# Patient Record
Sex: Female | Born: 1980 | Race: Black or African American | Hispanic: No | Marital: Single | Smoking: Never smoker
Health system: Southern US, Community
[De-identification: ages and names within clinical notes are randomized; demographics above are authoritative.]

## PROBLEM LIST (undated history)

## (undated) DIAGNOSIS — F419 Anxiety disorder, unspecified: Secondary | ICD-10-CM

## (undated) HISTORY — PX: INDUCED ABORTION: SHX677

## (undated) HISTORY — PX: THORACIC OUTLET SURGERY: SHX2502

---

## 2004-12-28 ENCOUNTER — Emergency Department: Payer: Self-pay | Admitting: General Practice

## 2004-12-31 ENCOUNTER — Emergency Department: Payer: Self-pay | Admitting: Unknown Physician Specialty

## 2005-08-19 ENCOUNTER — Inpatient Hospital Stay: Payer: Self-pay

## 2010-02-07 ENCOUNTER — Ambulatory Visit: Payer: Self-pay | Admitting: Family Medicine

## 2010-06-21 ENCOUNTER — Observation Stay: Payer: Self-pay | Admitting: Obstetrics and Gynecology

## 2010-06-21 ENCOUNTER — Inpatient Hospital Stay: Payer: Self-pay

## 2012-07-30 ENCOUNTER — Emergency Department: Payer: Self-pay | Admitting: Unknown Physician Specialty

## 2012-07-30 LAB — DRUG SCREEN, URINE
Amphetamines, Ur Screen: NEGATIVE (ref ?–1000)
Cannabinoid 50 Ng, Ur ~~LOC~~: NEGATIVE (ref ?–50)
Cocaine Metabolite,Ur ~~LOC~~: NEGATIVE (ref ?–300)
MDMA (Ecstasy)Ur Screen: NEGATIVE (ref ?–500)
Methadone, Ur Screen: NEGATIVE (ref ?–300)
Opiate, Ur Screen: NEGATIVE (ref ?–300)
Phencyclidine (PCP) Ur S: NEGATIVE (ref ?–25)
Tricyclic, Ur Screen: NEGATIVE (ref ?–1000)

## 2012-07-30 LAB — COMPREHENSIVE METABOLIC PANEL
Albumin: 3.9 g/dL (ref 3.4–5.0)
Anion Gap: 5 — ABNORMAL LOW (ref 7–16)
BUN: 12 mg/dL (ref 7–18)
Bilirubin,Total: 0.3 mg/dL (ref 0.2–1.0)
Chloride: 109 mmol/L — ABNORMAL HIGH (ref 98–107)
Creatinine: 0.81 mg/dL (ref 0.60–1.30)
EGFR (African American): >60
EGFR (Non-African Amer.): >60
Glucose: 75 mg/dL (ref 65–99)
Osmolality: 278 (ref 275–301)
Potassium: 3.3 mmol/L — ABNORMAL LOW (ref 3.5–5.1)
SGOT(AST): 23 U/L (ref 15–37)
Sodium: 140 mmol/L (ref 136–145)
Total Protein: 8.6 g/dL — ABNORMAL HIGH (ref 6.4–8.2)

## 2012-07-30 LAB — ETHANOL
Ethanol %: <0.003 % (ref 0.000–0.080)
Ethanol: <3 mg/dL

## 2012-07-30 LAB — URINALYSIS, COMPLETE
Bacteria: NONE SEEN
Bilirubin,UR: NEGATIVE
Glucose,UR: NEGATIVE mg/dL (ref 0–75)
Leukocyte Esterase: NEGATIVE
Protein: NEGATIVE
RBC,UR: NONE SEEN /HPF (ref 0–5)
Specific Gravity: 1.008 (ref 1.003–1.030)
Squamous Epithelial: <1

## 2012-07-30 LAB — CBC
HCT: 38.7 % (ref 35.0–47.0)
MCV: 93 fL (ref 80–100)
Platelet: 212 10*3/uL (ref 150–440)
RBC: 4.18 10*6/uL (ref 3.80–5.20)
RDW: 13.2 % (ref 11.5–14.5)

## 2012-07-30 LAB — CK TOTAL AND CKMB (NOT AT ARMC): CK, Total: 70 U/L (ref 21–215)

## 2013-01-20 ENCOUNTER — Emergency Department: Payer: Self-pay | Admitting: Emergency Medicine

## 2013-01-20 LAB — COMPREHENSIVE METABOLIC PANEL
Albumin: 3.9 g/dL (ref 3.4–5.0)
Alkaline Phosphatase: 74 U/L (ref 50–136)
BUN: 13 mg/dL (ref 7–18)
Bilirubin,Total: 0.2 mg/dL (ref 0.2–1.0)
Chloride: 107 mmol/L (ref 98–107)
Co2: 23 mmol/L (ref 21–32)
Creatinine: 0.85 mg/dL (ref 0.60–1.30)
EGFR (African American): >60
EGFR (Non-African Amer.): >60
Glucose: 107 mg/dL — ABNORMAL HIGH (ref 65–99)
SGOT(AST): 15 U/L (ref 15–37)
SGPT (ALT): 16 U/L (ref 12–78)
Total Protein: 8.1 g/dL (ref 6.4–8.2)

## 2013-01-20 LAB — CBC
MCHC: 34.1 g/dL (ref 32.0–36.0)
MCV: 94 fL (ref 80–100)
Platelet: 195 10*3/uL (ref 150–440)
RDW: 13.8 % (ref 11.5–14.5)
WBC: 10.2 10*3/uL (ref 3.6–11.0)

## 2014-09-16 ENCOUNTER — Emergency Department
Admission: EM | Admit: 2014-09-16 | Discharge: 2014-09-16 | Disposition: A | Discharge status: Home / Self Care | Payer: Medicaid Other | Attending: Emergency Medicine | Admitting: Emergency Medicine

## 2014-09-16 ENCOUNTER — Other Ambulatory Visit: Payer: Self-pay

## 2014-09-16 ENCOUNTER — Encounter: Payer: Self-pay | Admitting: Emergency Medicine

## 2014-09-16 ENCOUNTER — Emergency Department: Payer: Medicaid Other

## 2014-09-16 DIAGNOSIS — R1030 Lower abdominal pain, unspecified: Secondary | ICD-10-CM | POA: Insufficient documentation

## 2014-09-16 DIAGNOSIS — Z3202 Encounter for pregnancy test, result negative: Secondary | ICD-10-CM | POA: Insufficient documentation

## 2014-09-16 DIAGNOSIS — R55 Syncope and collapse: Secondary | ICD-10-CM | POA: Diagnosis not present

## 2014-09-16 DIAGNOSIS — R070 Pain in throat: Secondary | ICD-10-CM | POA: Diagnosis not present

## 2014-09-16 DIAGNOSIS — R0602 Shortness of breath: Secondary | ICD-10-CM | POA: Diagnosis present

## 2014-09-16 LAB — WET PREP, GENITAL
Clue Cells Wet Prep HPF POC: NONE SEEN
TRICH WET PREP: NONE SEEN
YEAST WET PREP: NONE SEEN

## 2014-09-16 LAB — URINALYSIS COMPLETE WITH MICROSCOPIC (ARMC ONLY)
BILIRUBIN URINE: NEGATIVE
Bacteria, UA: NONE SEEN
GLUCOSE, UA: NEGATIVE mg/dL
Hgb urine dipstick: NEGATIVE
Ketones, ur: NEGATIVE mg/dL
Leukocytes, UA: NEGATIVE
NITRITE: NEGATIVE
PH: 6 (ref 5.0–8.0)
Protein, ur: NEGATIVE mg/dL
RBC / HPF: NONE SEEN RBC/hpf (ref 0–5)
Specific Gravity, Urine: 1.016 (ref 1.005–1.030)

## 2014-09-16 LAB — BASIC METABOLIC PANEL
ANION GAP: 9 (ref 5–15)
BUN: 17 mg/dL (ref 6–20)
CO2: 28 mmol/L (ref 22–32)
Calcium: 9.9 mg/dL (ref 8.9–10.3)
Chloride: 105 mmol/L (ref 101–111)
Creatinine, Ser: 0.74 mg/dL (ref 0.44–1.00)
GFR calc Af Amer: >60 mL/min (ref >60)
GFR calc non Af Amer: >60 mL/min (ref >60)
GLUCOSE: 103 mg/dL — AB (ref 65–99)
Potassium: 3.8 mmol/L (ref 3.5–5.1)
Sodium: 142 mmol/L (ref 135–145)

## 2014-09-16 LAB — HEPATIC FUNCTION PANEL
ALK PHOS: 48 U/L (ref 38–126)
ALT: 15 U/L (ref 14–54)
AST: 21 U/L (ref 15–41)
Albumin: 4.1 g/dL (ref 3.5–5.0)
Total Bilirubin: 0.3 mg/dL (ref 0.3–1.2)
Total Protein: 7.8 g/dL (ref 6.5–8.1)

## 2014-09-16 LAB — CBC
HCT: 37.4 % (ref 35.0–47.0)
Hemoglobin: 12.3 g/dL (ref 12.0–16.0)
MCH: 31.5 pg (ref 26.0–34.0)
MCHC: 32.9 g/dL (ref 32.0–36.0)
MCV: 95.6 fL (ref 80.0–100.0)
Platelets: 184 10*3/uL (ref 150–440)
RBC: 3.91 MIL/uL (ref 3.80–5.20)
RDW: 13.7 % (ref 11.5–14.5)
WBC: 7.2 10*3/uL (ref 3.6–11.0)

## 2014-09-16 LAB — CHLAMYDIA/NGC RT PCR (ARMC ONLY)
Chlamydia Tr: NOT DETECTED
N gonorrhoeae: NOT DETECTED

## 2014-09-16 LAB — LIPASE, BLOOD: Lipase: 29 U/L (ref 22–51)

## 2014-09-16 LAB — TROPONIN I

## 2014-09-16 LAB — POC URINE PREG, ED: PREG TEST UR: NEGATIVE

## 2014-09-16 MED ORDER — GI COCKTAIL ~~LOC~~
30.0000 mL | Freq: Once | ORAL | Status: AC
  Administered 2014-09-16: 30 mL via ORAL

## 2014-09-16 MED ORDER — GI COCKTAIL ~~LOC~~
ORAL | Status: AC
  Administered 2014-09-16: 30 mL via ORAL
  Filled 2014-09-16: qty 30

## 2014-09-16 NOTE — ED Notes (Signed)
RN called lab to add on troponin, stated they would.

## 2014-09-16 NOTE — ED Provider Notes (Addendum)
[facility] Emergency Department Provider Note ____________________________________________  Time seen: Approximately 550 AM  I have reviewed the triage vital signs and the nursing notes.   HISTORY  Chief Complaint Shortness of Breath    HPI Shelby Hoffman is a 34 y.o. female without pertinent medical history presents today with shortness of breath and a near-syncopal episode at work earlier [employer]. Patient says that she reported to her job at [employer] and when she began working she was becoming short of breath with exertion. She said that she stepped outside to get fresh air and at that point became lightheaded and had a feeling that she would pass out however did not. She also complaining of difficulty swallowing. However, denies any throat pain,nausea vomiting or diarrhea. At this time the patient is asymptomatic and without pain. Denied any chest pain at any point.   No past medical history on file.  There are no active problems to display for this patient.   No past surgical history on file.  No current outpatient prescriptions on file.  Allergies Tomato  No family history on file.  Social History History  Substance Use Topics  . Smoking status: Never Smoker   . Smokeless tobacco: Not on file  . Alcohol Use: No    Review of Systems Constitutional: No fever/chills Eyes: No visual changes. ENT: No sore throat. Cardiovascular: Denies chest pain. Respiratory: As above  Gastrointestinal: No abdominal pain.  No nausea, no vomiting.  No diarrhea.  No constipation. Genitourinary: Negative for dysuria. Musculoskeletal: Negative for back pain. Skin: Negative for rash. Neurological: Negative for headaches, focal weakness or numbness.  10-point ROS otherwise negative.  ____________________________________________   PHYSICAL EXAM:  VITAL SIGNS: ED Triage Vitals  Enc Vitals Group     BP 09/16/14 0159 101/53 mmHg     Pulse Rate 09/16/14  0159 93     Resp 09/16/14 0159 18     Temp 09/16/14 0159 98.1 F (36.7 C)     Temp Source 09/16/14 0159 Oral     SpO2 09/16/14 0159 100 %     Weight 09/16/14 0159 154 lb (69.854 kg)     Height 09/16/14 0159 5\' 1"  (1.549 m)     Head Cir --      Peak Flow --      Pain Score 09/16/14 0201 6     Pain Loc --      Pain Edu? --      Excl. in GC? --     Constitutional: Alert and oriented. Well appearing and in no acute distress. Eyes: Conjunctivae are normal. PERRL. EOMI. Head: Atraumatic. Nose: No congestion/rhinnorhea. Mouth/Throat: Mucous membranes are moist.  Oropharynx non-erythematous. Neck: No stridor.   Cardiovascular: Normal rate, regular rhythm. Grossly normal heart sounds.  Good peripheral circulation. Respiratory: Normal respiratory effort.  No retractions. Lungs CTAB. Gastrointestinal: Soft but with suprapubic tenderness palpation. No distention. No abdominal bruits. No CVA tenderness. Genitourinary: Normal external exam. Bimanual exam with minimal clear discharge. Bimanual exam with CMT as well as uterine tenderness. There is no adnexal tenderness or masses. Musculoskeletal: No lower extremity tenderness nor edema.  No joint effusions. Neurologic:  Normal speech and language. No gross focal neurologic deficits are appreciated. Speech is normal. No gait instability. Skin:  Skin is warm, dry and intact. No rash noted. Psychiatric: Mood and affect are normal. Speech and behavior are normal.  ____________________________________________   LABS (all labs ordered are listed, but only abnormal results are displayed)  Labs Reviewed  BASIC METABOLIC PANEL - Abnormal; Notable for the following:    Glucose, Bld 103 (*)    All other components within normal limits  URINALYSIS COMPLETEWITH MICROSCOPIC ([facility] ONLY) - Abnormal; Notable for the following:    Color, Urine YELLOW (*)    APPearance CLEAR (*)    Squamous Epithelial / LPF 0-5 (*)    All other components within normal  limits  HEPATIC FUNCTION PANEL - Abnormal; Notable for the following:    Bilirubin, Direct <0.1 (*)    All other components within normal limits  WET PREP, GENITAL  CHLAMYDIA/NGC RT PCR ([facility] ONLY)  CBC  TROPONIN I  LIPASE, BLOOD  POC URINE PREG, ED   ____________________________________________  EKG  ED ECG REPORT I, Arelia Longest, the attending physician, personally viewed and interpreted this ECG.   Date: 09/16/2014  EKG Time: 208  Rate: 70  Rhythm: normal sinus rhythm with marked sinus arrhythmia  Axis: Normal axis  Intervals:none  ST&T Change: No ST elevations or depressions. No abnormal T-wave inversions.  ____________________________________________  RADIOLOGY  Negative chest x-ray ____________________________________________   PROCEDURES   ____________________________________________   INITIAL IMPRESSION / ASSESSMENT AND PLAN / ED COURSE  Pertinent labs & imaging results that were available during my care of the patient were reviewed by me and considered in my medical decision making (see chart for details).  ----------------------------------------- 7:14 AM on 09/16/2014 -----------------------------------------  Patient resting comfortably without any complaints at this time. Reassuring workup. Possible early start to viral illness. Patient's other daughter was recently sick with malaise and vomiting. Patient also with nonspecific symptoms which could also signal the start of a viral illness. Advised the patient to drink plenty of fluids and use hot tea or cough drops for throat irritation. No stricture or belly pain worsens or moves to the right lower part of the abdomen. He should not concern for sexual transmitted diseases. Only a few white blood cells on the wet mount. ____________________________________________   FINAL CLINICAL IMPRESSION(S) / ED DIAGNOSES  Acute near syncope. Acute shortness of breath. Acute throat pain. Initial  visit.     Myrna Blazer, MD 09/16/14 0715  No tenderness over McBurney's point on exam. Also patient denies being on any hormone supplements of birth control. Is PERC negative.  Myrna Blazer, MD 09/16/14 [phone]

## 2014-09-16 NOTE — ED Notes (Signed)
Patient states that around midnight she was at work and started having difficulty breathing.

## 2014-09-16 NOTE — Discharge Instructions (Signed)
Abdominal Pain, Women °Abdominal (stomach, pelvic, or belly) pain can be caused by many things. It is important to tell your doctor: °· The location of the pain. °· Does it come and go or is it present all the time? °· Are there things that start the pain (eating certain foods, exercise)? °· Are there other symptoms associated with the pain (fever, nausea, vomiting, diarrhea)? °All of this is helpful to know when trying to find the cause of the pain. °CAUSES  °· Stomach: virus or bacteria infection, or ulcer. °· Intestine: appendicitis (inflamed appendix), regional ileitis (Crohn's disease), ulcerative colitis (inflamed colon), irritable bowel syndrome, diverticulitis (inflamed diverticulum of the colon), or cancer of the stomach or intestine. °· Gallbladder disease or stones in the gallbladder. °· Kidney disease, kidney stones, or infection. °· Pancreas infection or cancer. °· Fibromyalgia (pain disorder). °· Diseases of the female organs: °¨ Uterus: fibroid (non-cancerous) tumors or infection. °¨ Fallopian tubes: infection or tubal pregnancy. °¨ Ovary: cysts or tumors. °¨ Pelvic adhesions (scar tissue). °¨ Endometriosis (uterus lining tissue growing in the pelvis and on the pelvic organs). °¨ Pelvic congestion syndrome (female organs filling up with blood just before the menstrual period). °¨ Pain with the menstrual period. °¨ Pain with ovulation (producing an egg). °¨ Pain with an IUD (intrauterine device, birth control) in the uterus. °¨ Cancer of the female organs. °· Functional pain (pain not caused by a disease, may improve without treatment). °· Psychological pain. °· Depression. °DIAGNOSIS  °Your doctor will decide the seriousness of your pain by doing an examination. °· Blood tests. °· X-rays. °· Ultrasound. °· CT scan (computed tomography, special type of X-ray). °· MRI (magnetic resonance imaging). °· Cultures, for infection. °· Barium enema (dye inserted in the large intestine, to better view it with  X-rays). °· Colonoscopy (looking in intestine with a lighted tube). °· Laparoscopy (minor surgery, looking in abdomen with a lighted tube). °· Major abdominal exploratory surgery (looking in abdomen with a large incision). °TREATMENT  °The treatment will depend on the cause of the pain.  °· Many cases can be observed and treated at home. °· Over-the-counter medicines recommended by your caregiver. °· Prescription medicine. °· Antibiotics, for infection. °· Birth control pills, for painful periods or for ovulation pain. °· Hormone treatment, for endometriosis. °· Nerve blocking injections. °· Physical therapy. °· Antidepressants. °· Counseling with a psychologist or psychiatrist. °· Minor or major surgery. °HOME CARE INSTRUCTIONS  °· Do not take laxatives, unless directed by your caregiver. °· Take over-the-counter pain medicine only if ordered by your caregiver. Do not take aspirin because it can cause an upset stomach or bleeding. °· Try a clear liquid diet (broth or water) as ordered by your caregiver. Slowly move to a bland diet, as tolerated, if the pain is related to the stomach or intestine. °· Have a thermometer and take your temperature several times a day, and record it. °· Bed rest and sleep, if it helps the pain. °· Avoid sexual intercourse, if it causes pain. °· Avoid stressful situations. °· Keep your follow-up appointments and tests, as your caregiver orders. °· If the pain does not go away with medicine or surgery, you may try: °¨ Acupuncture. °¨ Relaxation exercises (yoga, meditation). °¨ Group therapy. °¨ Counseling. °SEEK MEDICAL CARE IF:  °· You notice certain foods cause stomach pain. °· Your home care treatment is not helping your pain. °· You need stronger pain medicine. °· You want your IUD removed. °· You feel faint or   lightheaded. °· You develop nausea and vomiting. °· You develop a rash. °· You are having side effects or an allergy to your medicine. °SEEK IMMEDIATE MEDICAL CARE IF:  °· Your  pain does not go away or gets worse. °· You have a fever. °· Your pain is felt only in portions of the abdomen. The right side could possibly be appendicitis. The left lower portion of the abdomen could be colitis or diverticulitis. °· You are passing blood in your stools (bright red or black tarry stools, with or without vomiting). °· You have blood in your urine. °· You develop chills, with or without a fever. °· You pass out. °MAKE SURE YOU:  °· Understand these instructions. °· Will watch your condition. °· Will get help right away if you are not doing well or get worse. °Document Released: 01/11/2007 Document Revised: 07/31/2013 Document Reviewed: 01/31/2009 °ExitCare® Patient Information ©2015 [employer]. This information is not intended to replace advice given to you by your health care provider. Make sure you discuss any questions you have with your health care provider. ° °

## 2015-05-30 ENCOUNTER — Emergency Department: Payer: No Typology Code available for payment source

## 2015-05-30 ENCOUNTER — Emergency Department
Admission: EM | Admit: 2015-05-30 | Discharge: 2015-05-30 | Disposition: A | Discharge status: Home / Self Care | Payer: No Typology Code available for payment source | Attending: Emergency Medicine | Admitting: Emergency Medicine

## 2015-05-30 ENCOUNTER — Encounter: Payer: Self-pay | Admitting: *Deleted

## 2015-05-30 DIAGNOSIS — S80211A Abrasion, right knee, initial encounter: Secondary | ICD-10-CM | POA: Diagnosis not present

## 2015-05-30 DIAGNOSIS — S8001XA Contusion of right knee, initial encounter: Secondary | ICD-10-CM | POA: Insufficient documentation

## 2015-05-30 DIAGNOSIS — Y92410 Unspecified street and highway as the place of occurrence of the external cause: Secondary | ICD-10-CM | POA: Diagnosis not present

## 2015-05-30 DIAGNOSIS — S199XXA Unspecified injury of neck, initial encounter: Secondary | ICD-10-CM | POA: Diagnosis present

## 2015-05-30 DIAGNOSIS — S161XXA Strain of muscle, fascia and tendon at neck level, initial encounter: Secondary | ICD-10-CM

## 2015-05-30 DIAGNOSIS — Y9389 Activity, other specified: Secondary | ICD-10-CM | POA: Diagnosis not present

## 2015-05-30 DIAGNOSIS — S46911A Strain of unspecified muscle, fascia and tendon at shoulder and upper arm level, right arm, initial encounter: Secondary | ICD-10-CM | POA: Insufficient documentation

## 2015-05-30 DIAGNOSIS — Y998 Other external cause status: Secondary | ICD-10-CM | POA: Insufficient documentation

## 2015-05-30 MED ORDER — IBUPROFEN 800 MG PO TABS
800.0000 mg | ORAL_TABLET | Freq: Once | ORAL | Status: AC
  Administered 2015-05-30: 800 mg via ORAL

## 2015-05-30 MED ORDER — CYCLOBENZAPRINE HCL 10 MG PO TABS: 10.0000 mg | ORAL_TABLET | Freq: Three times a day (TID) | ORAL | Status: DC | PRN

## 2015-05-30 MED ORDER — HYDROCODONE-ACETAMINOPHEN 5-325 MG PO TABS: 1.0000 | ORAL_TABLET | ORAL | Status: DC | PRN

## 2015-05-30 MED ORDER — IBUPROFEN 800 MG PO TABS: 800.0000 mg | ORAL_TABLET | Freq: Three times a day (TID) | ORAL | Status: DC | PRN

## 2015-05-30 MED ORDER — HYDROCODONE-ACETAMINOPHEN 5-325 MG PO TABS
ORAL_TABLET | ORAL | Status: AC
  Administered 2015-05-30: 1 via ORAL
  Filled 2015-05-30: qty 1

## 2015-05-30 MED ORDER — HYDROCODONE-ACETAMINOPHEN 5-325 MG PO TABS
1.0000 | ORAL_TABLET | Freq: Once | ORAL | Status: AC
  Administered 2015-05-30: 1 via ORAL

## 2015-05-30 MED ORDER — IBUPROFEN 800 MG PO TABS
ORAL_TABLET | ORAL | Status: AC
  Filled 2015-05-30: qty 1
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
## 2015-12-16 ENCOUNTER — Encounter: Payer: Self-pay | Admitting: Emergency Medicine

## 2015-12-16 ENCOUNTER — Emergency Department
Admission: EM | Admit: 2015-12-16 | Discharge: 2015-12-16 | Disposition: A | Discharge status: Home / Self Care | Payer: Medicaid Other | Attending: Emergency Medicine | Admitting: Emergency Medicine

## 2015-12-16 ENCOUNTER — Emergency Department: Payer: Medicaid Other

## 2015-12-16 DIAGNOSIS — Z791 Long term (current) use of non-steroidal anti-inflammatories (NSAID): Secondary | ICD-10-CM | POA: Insufficient documentation

## 2015-12-16 DIAGNOSIS — R110 Nausea: Secondary | ICD-10-CM | POA: Insufficient documentation

## 2015-12-16 DIAGNOSIS — R1030 Lower abdominal pain, unspecified: Secondary | ICD-10-CM | POA: Insufficient documentation

## 2015-12-16 DIAGNOSIS — Z3A01 Less than 8 weeks gestation of pregnancy: Secondary | ICD-10-CM | POA: Insufficient documentation

## 2015-12-16 DIAGNOSIS — O26891 Other specified pregnancy related conditions, first trimester: Secondary | ICD-10-CM | POA: Diagnosis present

## 2015-12-16 LAB — CBC
HCT: 33.7 % — ABNORMAL LOW (ref 35.0–47.0)
HEMOGLOBIN: 11.8 g/dL — AB (ref 12.0–16.0)
MCH: 32.6 pg (ref 26.0–34.0)
MCHC: 35.1 g/dL (ref 32.0–36.0)
MCV: 92.7 fL (ref 80.0–100.0)
Platelets: 183 10*3/uL (ref 150–440)
RBC: 3.63 MIL/uL — ABNORMAL LOW (ref 3.80–5.20)
RDW: 13.6 % (ref 11.5–14.5)
WBC: 6.9 10*3/uL (ref 3.6–11.0)

## 2015-12-16 LAB — URINALYSIS COMPLETE WITH MICROSCOPIC (ARMC ONLY)
BILIRUBIN URINE: NEGATIVE
Bacteria, UA: NONE SEEN
Glucose, UA: NEGATIVE mg/dL
HGB URINE DIPSTICK: NEGATIVE
LEUKOCYTES UA: NEGATIVE
Nitrite: NEGATIVE
PH: 6 (ref 5.0–8.0)
PROTEIN: NEGATIVE mg/dL
RBC / HPF: NONE SEEN RBC/hpf (ref 0–5)
Specific Gravity, Urine: 1.028 (ref 1.005–1.030)
WBC UA: NONE SEEN WBC/hpf (ref 0–5)

## 2015-12-16 LAB — COMPREHENSIVE METABOLIC PANEL
ALBUMIN: 3.5 g/dL (ref 3.5–5.0)
ALK PHOS: 44 U/L (ref 38–126)
ALT: 19 U/L (ref 14–54)
ANION GAP: 7 (ref 5–15)
AST: 17 U/L (ref 15–41)
BUN: 17 mg/dL (ref 6–20)
CALCIUM: 8.7 mg/dL — AB (ref 8.9–10.3)
CO2: 21 mmol/L — AB (ref 22–32)
CREATININE: 0.58 mg/dL (ref 0.44–1.00)
Chloride: 105 mmol/L (ref 101–111)
GFR calc Af Amer: >60 mL/min (ref >60)
GFR calc non Af Amer: >60 mL/min (ref >60)
GLUCOSE: 87 mg/dL (ref 65–99)
Potassium: 3.4 mmol/L — ABNORMAL LOW (ref 3.5–5.1)
SODIUM: 133 mmol/L — AB (ref 135–145)
Total Bilirubin: 0.2 mg/dL — ABNORMAL LOW (ref 0.3–1.2)
Total Protein: 7.7 g/dL (ref 6.5–8.1)

## 2015-12-16 LAB — LIPASE, BLOOD: Lipase: 19 U/L (ref 11–51)

## 2015-12-16 LAB — HCG, QUANTITATIVE, PREGNANCY: hCG, Beta Chain, Quant, S: 57197 m[IU]/mL — ABNORMAL HIGH (ref <5)

## 2015-12-16 MED ORDER — ONDANSETRON 4 MG PO TBDP
4.0000 mg | ORAL_TABLET | Freq: Once | ORAL | Status: AC
  Administered 2015-12-16: 4 mg via ORAL
  Filled 2015-12-16: qty 1

## 2015-12-16 NOTE — ED Provider Notes (Signed)
[facility] Emergency Department Provider Note  Time seen: 10:37 AM  I have reviewed the triage vital signs and the nursing notes.   HISTORY  Chief Complaint Abdominal Pain    HPI Shelby Hoffman is a 35 y.o. female G6 P4 A1 who presents the emergency department lower abdominal cramping approximately [redacted] weeks pregnant. According to the patient for the past several days she has experienced lower abdominal cramping somewhat worse today. States her last measure. Was beginning of August, she is not sure on the exact dates. States some nausea but denies any vomiting. Denies any diarrhea. Describes her abdominal pain is mild in the lower abdomen and cramping sensation. Denies any vaginal bleeding or discharge. His dysuria.  History reviewed. No pertinent past medical history.  There are no active problems to display for this patient.   History reviewed. No pertinent surgical history.  Prior to Admission medications   Medication Sig Start Date End Date Taking? Authorizing Provider  cyclobenzaprine (FLEXERIL) 10 MG tablet Take 1 tablet (10 mg total) by mouth every 8 (eight) hours as needed for muscle spasms. 05/30/15   Ignacia Bayley, PA-C  HYDROcodone-acetaminophen (NORCO) 5-325 MG tablet Take 1 tablet by mouth every 4 (four) hours as needed for moderate pain. 05/30/15   Ignacia Bayley, PA-C  ibuprofen (ADVIL,MOTRIN) 800 MG tablet Take 1 tablet (800 mg total) by mouth every 8 (eight) hours as needed. 05/30/15   Ignacia Bayley, PA-C    Allergies  Allergen Reactions  . Tomato     History reviewed. No pertinent family history.  Social History Social History  Substance Use Topics  . Smoking status: Never Smoker  . Smokeless tobacco: Never Used  . Alcohol use No    Review of Systems Constitutional: Negative for fever Cardiovascular: Negative for chest pain. Respiratory: Negative for shortness of breath. Gastrointestinal:Lower abdominal cramping. Positive for  nausea. Genitourinary: Negative for dysuria. Neurological: Negative for headache 10-point ROS otherwise negative.  ____________________________________________   PHYSICAL EXAM:  VITAL SIGNS: ED Triage Vitals  Enc Vitals Group     BP 12/16/15 0926 102/69     Pulse Rate 12/16/15 0926 73     Resp 12/16/15 0926 18     Temp 12/16/15 0926 98.1 F (36.7 C)     Temp Source 12/16/15 0926 Oral     SpO2 12/16/15 0926 100 %     Weight 12/16/15 0927 183 lb (83 kg)     Height 12/16/15 0927 5\' 2"  (1.575 m)     Head Circumference --      Peak Flow --      Pain Score 12/16/15 0927 5     Pain Loc --      Pain Edu? --      Excl. in GC? --     Constitutional: Alert and oriented. Well appearing and in no distress. Eyes: Normal exam ENT   Head: Normocephalic and atraumatic.   Mouth/Throat: Mucous membranes are moist. Cardiovascular: Normal rate, regular rhythm. No murmur Respiratory: Normal respiratory effort without tachypnea nor retractions. Breath sounds are clear Gastrointestinal: Soft and nontender. No distention.   Musculoskeletal: Nontender with normal range of motion in all extremities.  Neurologic:  Normal speech and language. No gross focal neurologic deficits Skin:  Skin is warm, dry and intact.  Psychiatric: Mood and affect are normal.   ____________________________________________     RADIOLOGY  Ultrasound consistent with single IUP 7 weeks 6 days. Also with 2 complex cysts in the right ovary which will  need follow-up in 6 weeks.  ____________________________________________   INITIAL IMPRESSION / ASSESSMENT AND PLAN / ED COURSE  Pertinent labs & imaging results that were available during my care of the patient were reviewed by me and considered in my medical decision making (see chart for details).  The patient presents the emergency department with lower abdominal cramping approximately 6-[redacted] weeks pregnant by LMP. Patient states nausea as well. I discussed the  pros and cons of Zofran, the patient is agreeable to Zofran. We'll dose Zofran ODT in the emergency department. We'll check labs and proceed with an ultrasound to further evaluate. Patient is agreeable to this plan.  Patient's ultrasound consistent with 7 week 6 day IUP, also with complex cyst to the right ovary which will need follow-up in 6 weeks. I discussed this with the patient was agreeable follow up OB/GYN. ____________________________________________   FINAL CLINICAL IMPRESSION(S) / ED DIAGNOSES  Lower abdominal pain during pregnancy    Minna AntisKevin Christofer Shen, MD 12/16/15 1320

## 2015-12-16 NOTE — ED Triage Notes (Signed)
Pt to ed with c/o abd pain and vomiting that started at 8 am today.  Pt reports [redacted] weeks pregnant. [id number]

## 2015-12-16 NOTE — ED Notes (Signed)
Patient transported to Ultrasound 

## 2015-12-16 NOTE — Discharge Instructions (Signed)
Please follow-up OB/GYN as soon as possible. As we discussed your ultrasound shows a single live intrauterine pregnancy at 7 weeks and 6 days. It also shows 2 complex cyst on the right ovary which will need a repeat ultrasound in approximately 6 weeks to ensure resolution.

## 2015-12-16 NOTE — ED Notes (Signed)
Pt ambulatory to lobby. NAD noted. 

## 2015-12-22 ENCOUNTER — Emergency Department
Admission: EM | Admit: 2015-12-22 | Discharge: 2015-12-22 | Disposition: A | Discharge status: Home / Self Care | Payer: Medicaid Other | Attending: Emergency Medicine | Admitting: Emergency Medicine

## 2015-12-22 ENCOUNTER — Emergency Department: Payer: Medicaid Other

## 2015-12-22 DIAGNOSIS — M79661 Pain in right lower leg: Secondary | ICD-10-CM | POA: Insufficient documentation

## 2015-12-22 DIAGNOSIS — M79604 Pain in right leg: Secondary | ICD-10-CM

## 2015-12-22 LAB — BASIC METABOLIC PANEL
ANION GAP: 5 (ref 5–15)
BUN: 19 mg/dL (ref 6–20)
CALCIUM: 8.8 mg/dL — AB (ref 8.9–10.3)
CO2: 24 mmol/L (ref 22–32)
CREATININE: 0.61 mg/dL (ref 0.44–1.00)
Chloride: 105 mmol/L (ref 101–111)
Glucose, Bld: 96 mg/dL (ref 65–99)
Potassium: 3.1 mmol/L — ABNORMAL LOW (ref 3.5–5.1)
SODIUM: 134 mmol/L — AB (ref 135–145)

## 2015-12-22 LAB — CBC WITH DIFFERENTIAL/PLATELET
BASOS ABS: 0 10*3/uL (ref 0–0.1)
BASOS PCT: 0 %
EOS ABS: 0.1 10*3/uL (ref 0–0.7)
EOS PCT: 1 %
HCT: 31.9 % — ABNORMAL LOW (ref 35.0–47.0)
Hemoglobin: 11.3 g/dL — ABNORMAL LOW (ref 12.0–16.0)
Lymphocytes Relative: 30 %
Lymphs Abs: 1.8 10*3/uL (ref 1.0–3.6)
MCH: 32.4 pg (ref 26.0–34.0)
MCHC: 35.4 g/dL (ref 32.0–36.0)
MCV: 91.4 fL (ref 80.0–100.0)
MONO ABS: 0.4 10*3/uL (ref 0.2–0.9)
MONOS PCT: 7 %
NEUTROS ABS: 3.6 10*3/uL (ref 1.4–6.5)
Neutrophils Relative %: 62 %
PLATELETS: 177 10*3/uL (ref 150–440)
RBC: 3.49 MIL/uL — ABNORMAL LOW (ref 3.80–5.20)
RDW: 13.5 % (ref 11.5–14.5)
WBC: 5.8 10*3/uL (ref 3.6–11.0)

## 2015-12-22 MED ORDER — ACETAMINOPHEN 500 MG PO TABS: 500.0000 mg | ORAL_TABLET | Freq: Four times a day (QID) | ORAL | 0 refills | Status: DC | PRN

## 2015-12-22 NOTE — ED Triage Notes (Addendum)
Pt reports she woke up Friday morning with foot pain on the right. Pt denies any injury and states the pain is worsening. Pt states she has taken ibuprofen. C/o pain 8/10. Pt states she is 8 weeks and 6 days pregnant.

## 2015-12-22 NOTE — ED Notes (Signed)
Returned from U/S

## 2015-12-22 NOTE — Discharge Instructions (Signed)
Keep right foot elevated when not walking.   May take Tylenol only for pain during pregnancy.

## 2015-12-22 NOTE — ED Notes (Signed)
Pt is in ultrasound at this time

## 2015-12-22 NOTE — ED Provider Notes (Signed)
[facility] Emergency Department Provider Note  ____________________________________________  Time seen: Approximately 8:32 AM  I have reviewed the triage vital signs and the nursing notes.   HISTORY  Chief Complaint Foot Pain    HPI Shelby Hoffman is a 35 y.o. female, NAD, presents to the emergency department with 2 day history of pain in the right foot and calf. Pain began in right foot Friday evening and patient began to feel pain in right calf Saturday. Pain is now 8/10, constant, sharp, and worse with bearing weight. Denies any trauma, injury or fall to insight the pain. She has been taking ibuprofen for pain with no relief. Patient also reports she is 7-[redacted] weeks pregnant but denies any abdominal pain, nausea, vomiting, vaginal bleeding or discharge. Denies personal or family history of blood clots or DVT. Denies fever, chills, palpitations, chest pain, shortness of breath, wheezing. Has not noted any skin sores or open wounds.   No past medical history on file.  There are no active problems to display for this patient.   No past surgical history on file.  Prior to Admission medications   Medication Sig Start Date End Date Taking? Authorizing Provider  acetaminophen (TYLENOL) 500 MG tablet Take 1 tablet (500 mg total) by mouth every 6 (six) hours as needed for moderate pain. 12/22/15   Atavia Poppe L Ashlin Hidalgo, PA-C  cyclobenzaprine (FLEXERIL) 10 MG tablet Take 1 tablet (10 mg total) by mouth every 8 (eight) hours as needed for muscle spasms. 05/30/15   Ignacia Bayley, PA-C  HYDROcodone-acetaminophen (NORCO) 5-325 MG tablet Take 1 tablet by mouth every 4 (four) hours as needed for moderate pain. 05/30/15   Ignacia Bayley, PA-C  ibuprofen (ADVIL,MOTRIN) 800 MG tablet Take 1 tablet (800 mg total) by mouth every 8 (eight) hours as needed. 05/30/15   Ignacia Bayley, PA-C    Allergies Tomato  No family history on file.  Social History Social History  Substance Use Topics  .  Smoking status: Never Smoker  . Smokeless tobacco: Never Used  . Alcohol use No     Review of Systems  Constitutional: No fever, chills Cardiovascular: No chest pain, palpitations. Respiratory: No shortness of breath, wheezing Gastrointestinal:  No abdominal pain, nausea, vomiting.  Genitourinary: No vaginal discharge, bleeding Musculoskeletal: Pain in right foot and calf. No pain about right hip, thigh. Skin: Negative for rash, redness, warmth, skin sores. Neurological:  No numbness, weakness, tingling 10-point ROS otherwise negative.    ____________________________________________   PHYSICAL EXAM:  VITAL SIGNS: ED Triage Vitals  Enc Vitals Group     BP 12/22/15 0824 (!) 117/59     Pulse Rate 12/22/15 0824 78     Resp 12/22/15 0824 18     Temp 12/22/15 0824 98.5 F (36.9 C)     Temp Source 12/22/15 0824 Oral     SpO2 12/22/15 0824 99 %     Weight 12/22/15 0832 183 lb (83 kg)     Height 12/22/15 0832 5\' 2"  (1.575 m)     Head Circumference --      Peak Flow --      Pain Score 12/22/15 0832 8     Pain Loc --      Pain Edu? --      Excl. in GC? --     Constitutional: Alert and oriented. Well appearing and in no acute distress. Eyes: Conjunctivae are normal without icterus or injection.   Head: Atraumatic. Neck: No stridor. Neck supple with full ROM  without pain or difficulty.  Cardiovascular: Normal rate, regular rhythm. Normal S1 and S2.  Good peripheral circulation with 2+ pulses noted in the right lower extremity. Respiratory: Normal respiratory effort without tachypnea or retractions. Lungs CTAB with breath sounds noted in all lung fields. No wheeze, rhonchi, rales. Musculoskeletal: Right calf is tender to palpation posteriorly, no palpable chords felt. Positive Homan's sign on the right. No swelling or asymmetry of the calves. Compartments are soft bilaterally. Full range of motion of the right ankle and foot but with pain in the distal posterior calf. Neurologic:   Normal speech and language. No gross focal neurologic deficits are appreciated.  Skin:  Skin is warm, dry and intact. No rash, redness, swelling, abnormal warmth, skin sores noted. Psychiatric: Mood and affect are normal. Speech and behavior are normal. Patient exhibits appropriate insight and judgement.   ____________________________________________   LABS (all labs ordered are listed, but only abnormal results are displayed)  Labs Reviewed  CBC WITH DIFFERENTIAL/PLATELET - Abnormal; Notable for the following:       Result Value   RBC 3.49 (*)    Hemoglobin 11.3 (*)    HCT 31.9 (*)    All other components within normal limits  BASIC METABOLIC PANEL - Abnormal; Notable for the following:    Sodium 134 (*)    Potassium 3.1 (*)    Calcium 8.8 (*)    All other components within normal limits   ____________________________________________  EKG  None ____________________________________________  RADIOLOGY I, Hope Pigeon, personally viewed and evaluated these images [place] of right lower extremity as part of my medical decision making, as well as reviewing the written report by the radiologist.  US Venous Img Lower Unilateral Right  Result Date: 12/22/2015 CLINICAL DATA:  Right calf pain x5 days EXAM: RIGHT LOWER EXTREMITY VENOUS DOPPLER ULTRASOUND TECHNIQUE: Gray-scale sonography with compression, as well as color and duplex ultrasound, were performed to evaluate the deep venous system from the level of the common femoral vein through the popliteal and proximal calf veins. COMPARISON:  None FINDINGS: Normal compressibility of the common femoral, superficial femoral, and popliteal veins, as well as the proximal calf veins. No filling defects to suggest DVT on grayscale or color Doppler imaging. Doppler waveforms show normal direction of venous flow, normal respiratory phasicity and response to augmentation. Survey views of the contralateral common femoral vein are unremarkable. IMPRESSION:  No evidence of  lower extremity deep vein thrombosis, right. Electronically Signed   By: Corlis Leak M.D.   On: 12/22/2015 10:11    ____________________________________________    PROCEDURES  Procedure(s) performed: None   Procedures   Medications - No data to display   ____________________________________________   INITIAL IMPRESSION / ASSESSMENT AND PLAN / ED COURSE  Pertinent labs & imaging results that were available during my care of the patient were reviewed by me and considered in my medical decision making (see chart for details).  Clinical Course    Patient's diagnosis is consistent with Pain of right lower extremity. Patient will be discharged home with prescriptions for Tylenol take as directed. Patient is advised to discontinue taking ibuprofen or any other NSAIDs while pregnant or otherwise advised by her OB/GYN. Patient is to follow up with primary care provider if symptoms persist past this treatment course. Patient is given ED precautions to return to the ED for any worsening or new symptoms.    ____________________________________________  FINAL CLINICAL IMPRESSION(S) / ED DIAGNOSES  Final diagnoses:  Pain of right lower extremity  NEW MEDICATIONS STARTED DURING THIS VISIT:  Discharge Medication List as of 12/22/2015 10:23 AM    START taking these medications   Details  acetaminophen (TYLENOL) 500 MG tablet Take 1 tablet (500 mg total) by mouth every 6 (six) hours as needed for moderate pain., Starting Sun 12/22/2015, Print             Hope PigeonJami L Miyah Hampshire, PA-C 12/22/15 1137    Governor Rooksebecca Lord, MD 12/22/15 1217

## 2016-01-16 ENCOUNTER — Other Ambulatory Visit: Payer: Self-pay | Admitting: Family Medicine

## 2016-01-16 DIAGNOSIS — N83201 Unspecified ovarian cyst, right side: Secondary | ICD-10-CM

## 2016-01-17 LAB — OB RESULTS CONSOLE HEPATITIS B SURFACE ANTIGEN: Hepatitis B Surface Ag: NEGATIVE

## 2016-01-17 LAB — OB RESULTS CONSOLE RUBELLA ANTIBODY, IGM: Rubella: NON-IMMUNE/NOT IMMUNE

## 2016-01-17 LAB — OB RESULTS CONSOLE VARICELLA ZOSTER ANTIBODY, IGG: Varicella: IMMUNE

## 2016-01-18 LAB — OB RESULTS CONSOLE GC/CHLAMYDIA
Chlamydia: NEGATIVE
Gonorrhea: NEGATIVE

## 2016-01-22 ENCOUNTER — Ambulatory Visit
Admission: RE | Admit: 2016-01-22 | Discharge: 2016-01-22 | Disposition: A | Discharge status: Home / Self Care | Payer: Medicaid Other | Source: Ambulatory Visit | Attending: Family Medicine | Admitting: Family Medicine

## 2016-01-22 DIAGNOSIS — Z3A13 13 weeks gestation of pregnancy: Secondary | ICD-10-CM | POA: Insufficient documentation

## 2016-01-22 DIAGNOSIS — O3481 Maternal care for other abnormalities of pelvic organs, first trimester: Secondary | ICD-10-CM | POA: Diagnosis not present

## 2016-01-22 DIAGNOSIS — N8311 Corpus luteum cyst of right ovary: Secondary | ICD-10-CM | POA: Diagnosis not present

## 2016-01-22 DIAGNOSIS — N83201 Unspecified ovarian cyst, right side: Secondary | ICD-10-CM

## 2016-02-01 ENCOUNTER — Emergency Department
Admission: EM | Admit: 2016-02-01 | Discharge: 2016-02-01 | Disposition: A | Discharge status: Home / Self Care | Payer: Medicaid Other | Attending: Student in an Organized Health Care Education/Training Program | Admitting: Student in an Organized Health Care Education/Training Program

## 2016-02-01 ENCOUNTER — Encounter: Payer: Self-pay | Admitting: Emergency Medicine

## 2016-02-01 DIAGNOSIS — Z3A14 14 weeks gestation of pregnancy: Secondary | ICD-10-CM | POA: Insufficient documentation

## 2016-02-01 DIAGNOSIS — O4690 Antepartum hemorrhage, unspecified, unspecified trimester: Secondary | ICD-10-CM

## 2016-02-01 DIAGNOSIS — Z791 Long term (current) use of non-steroidal anti-inflammatories (NSAID): Secondary | ICD-10-CM | POA: Diagnosis not present

## 2016-02-01 DIAGNOSIS — O26851 Spotting complicating pregnancy, first trimester: Secondary | ICD-10-CM | POA: Diagnosis not present

## 2016-02-01 LAB — URINALYSIS COMPLETE WITH MICROSCOPIC (ARMC ONLY)
BILIRUBIN URINE: NEGATIVE
Glucose, UA: NEGATIVE mg/dL
Hgb urine dipstick: NEGATIVE
Leukocytes, UA: NEGATIVE
NITRITE: NEGATIVE
PH: 5 (ref 5.0–8.0)
Protein, ur: 30 mg/dL — AB
RBC / HPF: NONE SEEN RBC/hpf (ref 0–5)
SPECIFIC GRAVITY, URINE: 1.034 — AB (ref 1.005–1.030)

## 2016-02-01 LAB — WET PREP, GENITAL
CLUE CELLS WET PREP: NONE SEEN
Sperm: NONE SEEN
Trich, Wet Prep: NONE SEEN
Yeast Wet Prep HPF POC: NONE SEEN

## 2016-02-01 LAB — CHLAMYDIA/NGC RT PCR (ARMC ONLY)
CHLAMYDIA TR: NOT DETECTED
N GONORRHOEAE: NOT DETECTED

## 2016-02-01 NOTE — ED Provider Notes (Signed)
[facility] Emergency Department Provider Note    None    (approximate)  I have reviewed the triage vital signs and the nursing notes.   HISTORY  Chief Complaint Vaginal Bleeding    HPI Shelby Hoffman is a 35 y.o. female [phone] who is roughly [redacted] weeks pregnant presents with vaginal spotting today associated with lower abdominal cramping. Also has been having nausea and vomiting. No dysuria. Has been taking her prenatal vitamins. No family history of bleeding disorders. She denies any abdominal trauma. No recent intercourse. Denies any vaginal discharge. Denies any shortness of breath or chest pain.  States that she is feeling the fetus kick.    History reviewed. No pertinent past medical history.  There are no active problems to display for this patient.   Past Surgical History:  Procedure Laterality Date  . INDUCED ABORTION      Prior to Admission medications   Medication Sig Start Date End Date Taking? Authorizing Provider  acetaminophen (TYLENOL) 500 MG tablet Take 1 tablet (500 mg total) by mouth every 6 (six) hours as needed for moderate pain. 12/22/15   Jami L Hagler, PA-C  cyclobenzaprine (FLEXERIL) 10 MG tablet Take 1 tablet (10 mg total) by mouth every 8 (eight) hours as needed for muscle spasms. 05/30/15   Ignacia Bayley, PA-C  HYDROcodone-acetaminophen (NORCO) 5-325 MG tablet Take 1 tablet by mouth every 4 (four) hours as needed for moderate pain. 05/30/15   Ignacia Bayley, PA-C  ibuprofen (ADVIL,MOTRIN) 800 MG tablet Take 1 tablet (800 mg total) by mouth every 8 (eight) hours as needed. 05/30/15   Ignacia Bayley, PA-C    Allergies Tomato  No family history on file.  Social History Social History  Substance Use Topics  . Smoking status: Never Smoker  . Smokeless tobacco: Never Used  . Alcohol use No    Review of Systems Patient denies headaches, rhinorrhea, blurry vision, numbness, shortness of breath, chest pain, edema, cough,  abdominal pain, nausea, vomiting, diarrhea, dysuria, fevers, rashes or hallucinations unless otherwise stated above in HPI. ____________________________________________   PHYSICAL EXAM:  VITAL SIGNS: Vitals:   02/01/16 0052  BP: 95/68  Pulse: 78  Temp: 98.2 F (36.8 C)    Constitutional: Alert and oriented. Well appearing and in no acute distress. Eyes: Conjunctivae are normal. PERRL. EOMI. Head: Atraumatic. Nose: No congestion/rhinnorhea. Mouth/Throat: Mucous membranes are moist.  Oropharynx non-erythematous. Neck: No stridor. Painless ROM. No cervical spine tenderness to palpation Hematological/Lymphatic/Immunilogical: No cervical lymphadenopathy. Cardiovascular: Normal rate, regular rhythm. Grossly normal heart sounds.  Good peripheral circulation. Respiratory: Normal respiratory effort.  No retractions. Lungs CTAB. Gastrointestinal: gravid,  Soft and nontender. No distention. No abdominal bruits. No CVA tenderness. Genitourinary: normal appearing closed cervix.  No discharge, no bleeding. No CMT. Musculoskeletal: No lower extremity tenderness nor edema.  No joint effusions. Neurologic:  Normal speech and language. No gross focal neurologic deficits are appreciated. No gait instability. Skin:  Skin is warm, dry and intact. No rash noted. Psychiatric: Mood and affect are normal. Speech and behavior are normal.  ____________________________________________   LABS (all labs ordered are listed, but only abnormal results are displayed)  Results for orders placed or performed during the hospital encounter of 02/01/16 (from the past 24 hour(s))  Urinalysis complete, with microscopic [facility] only)     Status: Abnormal   Collection Time: 02/01/16  5:04 AM  Result Value Ref Range   Color, Urine YELLOW (A) YELLOW   APPearance CLEAR (A) CLEAR  Glucose, UA NEGATIVE NEGATIVE mg/dL   Bilirubin Urine NEGATIVE NEGATIVE   Ketones, ur 1+ (A) NEGATIVE mg/dL   Specific Gravity, Urine 1.034  (H) 1.005 - 1.030   Hgb urine dipstick NEGATIVE NEGATIVE   pH 5.0 5.0 - 8.0   Protein, ur 30 (A) NEGATIVE mg/dL   Nitrite NEGATIVE NEGATIVE   Leukocytes, UA NEGATIVE NEGATIVE   RBC / HPF NONE SEEN 0 - 5 RBC/hpf   WBC, UA 0-5 0 - 5 WBC/hpf   Bacteria, UA RARE (A) NONE SEEN   Squamous Epithelial / LPF 0-5 (A) NONE SEEN   Mucous PRESENT   Wet prep, genital     Status: Abnormal   Collection Time: 02/01/16  5:54 AM  Result Value Ref Range   Yeast Wet Prep HPF POC NONE SEEN NONE SEEN   Trich, Wet Prep NONE SEEN NONE SEEN   Clue Cells Wet Prep HPF POC NONE SEEN NONE SEEN   WBC, Wet Prep HPF POC FEW (A) NONE SEEN   Sperm NONE SEEN   Chlamydia/NGC rt PCR ([facility] only)     Status: None   Collection Time: 02/01/16  5:54 AM  Result Value Ref Range   Specimen source GC/Chlam ENDOCERVICAL    Chlamydia Tr NOT DETECTED NOT DETECTED   N gonorrhoeae NOT DETECTED NOT DETECTED   ____________________________________________  EKG____________________________________________  RADIOLOGY  Bedside ultrasound shows evidence of intrauterine pregnancy with reassuring fetal heart movement with a heart rate of 150 as well as reassuring fetal movement. ____________________________________________   PROCEDURES  Procedure(s) performed: none    Critical Care performed: no ____________________________________________   INITIAL IMPRESSION / ASSESSMENT AND PLAN / ED COURSE  Pertinent labs & imaging results that were available during my care of the patient were reviewed by me and considered in my medical decision making (see chart for details).  DDX: miscarriage, placenta previa, threatened Ab, ectopic  Shelby Hoffman is a 35 y.o. who presents to the ED with spotting and early pregnancy.  Patient is AFVSS in ED. Exam as above. Given current presentation have considered the above differential  Showed well appearing and in no acute distress. She is roughly 14 weeks by LMP. No evidence or report of  significant hemorrhage. Recent ultrasound showed no evidence of subchorionic hemorrhage and with an IUP. We'll check urine to evaluate for infection.  Pelvic exam was reassuring without any bleeding or CMT.  The patient will be placed on continuous pulse oximetry and telemetry for monitoring.  Laboratory evaluation will be sent to evaluate for the above complaints.     Clinical Course  Comment By Time  Bedside ultrasound shows reassuring fetal heart tones and fetal movement. Willy EddyPatrick Irean Kendricks, MD 11/04 [phone]  No evidence of infection.  Patient remains HDS.  Tolerating oral hydration.   Willy EddyPatrick Jaslynn Thome, MD 11/04 [phone]  Patient was able to tolerate PO and was able to ambulate with a steady gait.  No evidence of PID or infectious process.  No bleeding in ED.  Bedside US with reassuring fetal heart rate and movement.  Abdominal exam benign.  Have discussed with the patient and available family all diagnostics and treatments performed thus far and all questions were answered to the best of my ability. The patient demonstrates understanding and agreement with plan.   Willy EddyPatrick Johntae Broxterman, MD 11/04 [phone]    ----------------------------------------- 7:39 AM on 02/01/2016 -----------------------------------------  It came to my attention after the patient had left the ER that a blood type had not been collected. The patient  was called to inform her that she needed to have the Rh type collected and possible Rhogam administration in the next 72 hrs.   ____________________________________________   FINAL CLINICAL IMPRESSION(S) / ED DIAGNOSES  Final diagnoses:  Vaginal bleeding in pregnancy      NEW MEDICATIONS STARTED DURING THIS VISIT:  Discharge Medication List as of 02/01/2016  6:22 AM       Note:  This document was prepared using Dragon voice recognition software and may include unintentional dictation errors.    Willy EddyPatrick Gianlucas Evenson, MD 02/01/16 [phone]

## 2016-02-01 NOTE — ED Notes (Signed)
Pelvic cart at bedside, MD notified.

## 2016-02-01 NOTE — ED Triage Notes (Signed)
Pt states that she was wiping after using the restroom around 1900 and noticed some spotting on the tissue. Pt denies any other bleeding since that time. Pt is in NAD at this time.

## 2016-02-13 ENCOUNTER — Other Ambulatory Visit: Payer: Self-pay | Admitting: Family Medicine

## 2016-02-13 DIAGNOSIS — Z3482 Encounter for supervision of other normal pregnancy, second trimester: Secondary | ICD-10-CM

## 2016-03-04 ENCOUNTER — Ambulatory Visit
Admission: RE | Admit: 2016-03-04 | Discharge: 2016-03-04 | Disposition: A | Discharge status: Home / Self Care | Payer: Medicaid Other | Source: Ambulatory Visit | Attending: Family Medicine | Admitting: Family Medicine

## 2016-03-04 DIAGNOSIS — Z3482 Encounter for supervision of other normal pregnancy, second trimester: Secondary | ICD-10-CM | POA: Insufficient documentation

## 2016-03-04 DIAGNOSIS — Z3A19 19 weeks gestation of pregnancy: Secondary | ICD-10-CM | POA: Diagnosis not present

## 2016-03-30 NOTE — L&D Delivery Note (Signed)
Delivery Note At 7:46 AM a viable and healthy female who is as yet unnamed was delivered via Vaginal, Spontaneous Delivery (Presentation:OA).  APGAR: 8 ,9 ; weight 2090g .   Placenta status: spontaneous, intact.  Cord: 3VC with the following complications: none .  Cord pH: pending  Anesthesia:  epidural Episiotomy:  none Lacerations:  none Suture Repair: n/a Est. Blood Loss (mL):  100  Mom to postpartum.  Baby to special care Nursery.  36 y.o.femaleG6P4014 at 33+1wks presenting for PPROM 24 hours ago. She was started on latency abx and received her first dose of BMZ at 1516, 16 hrs ago.  She began cramping painfully in the middle of the night and began to make cervical change. 4g/2g mag started for short term tocolysis, but she progressed quickly to fully dilated. She did receive an epidural. She pushed once over an intact perineum and delivered a viable baby boy, who cried at the perineum. We did 60sec of delayed cord clamping, and mom cut the cord. He was passed to awaiting NICU staff. Cord gases were collected. The placenta delivered spontaneously and was intact. Placenta sent to pathology. EBL was minimal, fundus was firm.  Christeen DouglasBEASLEY, Sultan Pargas 06/10/2016, 7:57 AM

## 2016-05-06 ENCOUNTER — Other Ambulatory Visit: Payer: Self-pay | Admitting: Family Medicine

## 2016-05-06 DIAGNOSIS — Z3689 Encounter for other specified antenatal screening: Secondary | ICD-10-CM

## 2016-05-07 LAB — OB RESULTS CONSOLE RPR: RPR: NONREACTIVE

## 2016-05-25 ENCOUNTER — Encounter: Payer: Self-pay | Admitting: *Deleted

## 2016-05-25 ENCOUNTER — Ambulatory Visit
Admission: RE | Admit: 2016-05-25 | Discharge: 2016-05-25 | Disposition: A | Discharge status: Home / Self Care | Payer: Medicaid Other | Source: Ambulatory Visit | Attending: Maternal and Fetal Medicine | Admitting: Maternal and Fetal Medicine

## 2016-05-25 DIAGNOSIS — Z3689 Encounter for other specified antenatal screening: Secondary | ICD-10-CM | POA: Diagnosis not present

## 2016-05-25 DIAGNOSIS — Z3A30 30 weeks gestation of pregnancy: Secondary | ICD-10-CM | POA: Diagnosis not present

## 2016-05-25 DIAGNOSIS — Z6833 Body mass index (BMI) 33.0-33.9, adult: Secondary | ICD-10-CM | POA: Diagnosis not present

## 2016-05-25 DIAGNOSIS — O09523 Supervision of elderly multigravida, third trimester: Secondary | ICD-10-CM | POA: Insufficient documentation

## 2016-05-25 DIAGNOSIS — E669 Obesity, unspecified: Secondary | ICD-10-CM | POA: Insufficient documentation

## 2016-05-25 DIAGNOSIS — O99213 Obesity complicating pregnancy, third trimester: Secondary | ICD-10-CM | POA: Diagnosis not present

## 2016-06-09 ENCOUNTER — Inpatient Hospital Stay
Admission: EM | Admit: 2016-06-09 | Discharge: 2016-06-11 | DRG: 775 | Disposition: A | Discharge status: Home / Self Care | Payer: Medicaid Other | Attending: Obstetrics and Gynecology | Admitting: Obstetrics and Gynecology

## 2016-06-09 DIAGNOSIS — O42013 Preterm premature rupture of membranes, onset of labor within 24 hours of rupture, third trimester: Secondary | ICD-10-CM | POA: Diagnosis present

## 2016-06-09 DIAGNOSIS — K219 Gastro-esophageal reflux disease without esophagitis: Secondary | ICD-10-CM | POA: Diagnosis present

## 2016-06-09 DIAGNOSIS — O9962 Diseases of the digestive system complicating childbirth: Secondary | ICD-10-CM | POA: Diagnosis present

## 2016-06-09 DIAGNOSIS — Z3A33 33 weeks gestation of pregnancy: Secondary | ICD-10-CM

## 2016-06-09 DIAGNOSIS — O4290 Premature rupture of membranes, unspecified as to length of time between rupture and onset of labor, unspecified weeks of gestation: Secondary | ICD-10-CM | POA: Diagnosis present

## 2016-06-09 HISTORY — DX: Anxiety disorder, unspecified: F41.9

## 2016-06-09 LAB — CBC
HCT: 30.4 % — ABNORMAL LOW (ref 35.0–47.0)
HEMOGLOBIN: 10.5 g/dL — AB (ref 12.0–16.0)
MCH: 31.4 pg (ref 26.0–34.0)
MCHC: 34.5 g/dL (ref 32.0–36.0)
MCV: 90.9 fL (ref 80.0–100.0)
Platelets: 197 10*3/uL (ref 150–440)
RBC: 3.35 MIL/uL — AB (ref 3.80–5.20)
RDW: 14.6 % — ABNORMAL HIGH (ref 11.5–14.5)
WBC: 7.5 10*3/uL (ref 3.6–11.0)

## 2016-06-09 LAB — CHLAMYDIA/NGC RT PCR (ARMC ONLY)
Chlamydia Tr: NOT DETECTED
N GONORRHOEAE: NOT DETECTED

## 2016-06-09 LAB — URINE DRUG SCREEN, QUALITATIVE (ARMC ONLY)
AMPHETAMINES, UR SCREEN: NOT DETECTED
Barbiturates, Ur Screen: NOT DETECTED
Benzodiazepine, Ur Scrn: NOT DETECTED
COCAINE METABOLITE, UR ~~LOC~~: NOT DETECTED
Cannabinoid 50 Ng, Ur ~~LOC~~: NOT DETECTED
MDMA (ECSTASY) UR SCREEN: NOT DETECTED
METHADONE SCREEN, URINE: NOT DETECTED
Opiate, Ur Screen: NOT DETECTED
Phencyclidine (PCP) Ur S: NOT DETECTED
TRICYCLIC, UR SCREEN: NOT DETECTED

## 2016-06-09 LAB — URINALYSIS, ROUTINE W REFLEX MICROSCOPIC
Bacteria, UA: NONE SEEN
Bilirubin Urine: NEGATIVE
GLUCOSE, UA: NEGATIVE mg/dL
KETONES UR: NEGATIVE mg/dL
Leukocytes, UA: NEGATIVE
Nitrite: NEGATIVE
PROTEIN: 30 mg/dL — AB
Specific Gravity, Urine: 1.028 (ref 1.005–1.030)
pH: 6 (ref 5.0–8.0)

## 2016-06-09 LAB — WET PREP, GENITAL
Clue Cells Wet Prep HPF POC: NONE SEEN
Sperm: NONE SEEN
TRICH WET PREP: NONE SEEN
YEAST WET PREP: NONE SEEN

## 2016-06-09 LAB — TYPE AND SCREEN
ABO/RH(D): AB POS
ANTIBODY SCREEN: NEGATIVE

## 2016-06-09 LAB — RAPID HIV SCREEN (HIV 1/2 AB+AG)
HIV 1/2 Antibodies: NONREACTIVE
HIV-1 P24 Antigen - HIV24: NONREACTIVE

## 2016-06-09 MED ORDER — LACTATED RINGERS IV SOLN
INTRAVENOUS | Status: DC
  Administered 2016-06-09 (×2): via INTRAVENOUS

## 2016-06-09 MED ORDER — BETAMETHASONE SOD PHOS & ACET 6 (3-3) MG/ML IJ SUSP
INTRAMUSCULAR | Status: AC
  Administered 2016-06-09: 12 mg via INTRAMUSCULAR
  Filled 2016-06-09: qty 1

## 2016-06-09 MED ORDER — BETAMETHASONE SOD PHOS & ACET 6 (3-3) MG/ML IJ SUSP
12.0000 mg | INTRAMUSCULAR | Status: DC
  Administered 2016-06-09: 12 mg via INTRAMUSCULAR
  Filled 2016-06-09: qty 2

## 2016-06-09 MED ORDER — SODIUM CHLORIDE 0.9 % IV SOLN
2.0000 g | Freq: Four times a day (QID) | INTRAVENOUS | Status: DC
  Administered 2016-06-09 – 2016-06-10 (×3): 2 g via INTRAVENOUS
  Filled 2016-06-09 (×8): qty 2000

## 2016-06-09 MED ORDER — CALCIUM CARBONATE ANTACID 500 MG PO CHEW: 2.0000 | CHEWABLE_TABLET | ORAL | Status: DC | PRN

## 2016-06-09 MED ORDER — ACETAMINOPHEN 325 MG PO TABS
650.0000 mg | ORAL_TABLET | ORAL | Status: DC | PRN
  Administered 2016-06-10: 650 mg via ORAL
  Filled 2016-06-09: qty 2

## 2016-06-09 MED ORDER — DOCUSATE SODIUM 100 MG PO CAPS
100.0000 mg | ORAL_CAPSULE | Freq: Every day | ORAL | Status: DC
  Administered 2016-06-10: 100 mg via ORAL
  Filled 2016-06-09: qty 1

## 2016-06-09 MED ORDER — DEXTROSE 5 % IV SOLN
500.0000 mg | INTRAVENOUS | Status: DC
  Administered 2016-06-09: 500 mg via INTRAVENOUS
  Filled 2016-06-09 (×2): qty 500

## 2016-06-09 MED ORDER — AZITHROMYCIN 250 MG PO TABS: 500.0000 mg | ORAL_TABLET | Freq: Every day | ORAL | Status: DC

## 2016-06-09 MED ORDER — ZOLPIDEM TARTRATE 5 MG PO TABS
5.0000 mg | ORAL_TABLET | Freq: Every evening | ORAL | Status: DC | PRN
  Administered 2016-06-09: 5 mg via ORAL
  Filled 2016-06-09: qty 1

## 2016-06-09 MED ORDER — AMOXICILLIN 500 MG PO CAPS: 500.0000 mg | ORAL_CAPSULE | Freq: Three times a day (TID) | ORAL | Status: DC

## 2016-06-09 MED ORDER — PRENATAL MULTIVITAMIN CH
1.0000 | ORAL_TABLET | Freq: Every day | ORAL | Status: DC
  Administered 2016-06-10: 1 via ORAL
  Filled 2016-06-09 (×2): qty 1

## 2016-06-09 NOTE — OB Triage Note (Signed)
Pt arrived to obs rm 4 with c/o SROM at 0755 this morning. Pt place on monitor and Nitrazine preform, resulting in a positive Nitrazine pH paper. MD at bedside to assess. Will cont. To monitor.

## 2016-06-09 NOTE — Procedures (Signed)
Bedside ultrasound performed to assess fetal status after PPROM @ 33.0  Patient's last menstrual period was 10/22/2015. Estimated Date of Delivery: 07/28/16  Ultrasound: Fetus: cephalic, spine to maternal LEFT Placenta: intact, fundal AFI: 5.1 with DVP @ RUQ 2.7cm  Biometry: BPD: 1725w1d HC 3940w3d AC: 5271w6d FL 9159w2d EFW: 5lb 1oz AUA 33w 1d   Impression: fetus at estimated gestational age with minimal amnionitic fluid consistent with presentation of PPROM.  ----- Ranae Plumberhelsea Nikaela Coyne, MD Attending Obstetrician and Gynecologist [facility], Department of OB/GYN [facility]

## 2016-06-09 NOTE — H&P (Signed)
Shelby Hoffman is a 36 y.o. female [id number] at 33+0wks presenting for PPROM at 0800 this morning. She is still leaking. Ferning, pooling and nitrazine positive. No hx of Preterm labor or PPROM with other pregnancies.  Pregnancy c/b: - Rubella non-immune - Obesity - QUAD screen performed but results unavailable; will try to get   OB History    Gravida Para Term Preterm AB Living   6 4 4   1 4    SAB TAB Ectopic Multiple Live Births           4     Past Medical History:  Diagnosis Date  . Anxiety    Past Surgical History:  Procedure Laterality Date  . INDUCED ABORTION     Family History: family history includes Diabetes in her maternal grandmother; Miscarriages / [place] in her sister. Social History:  reports that she has never smoked. She has never used smokeless tobacco. She reports that she does not drink alcohol or use drugs.     Maternal Diabetes: No Genetic Screening: Normal Maternal Ultrasounds/Referrals: none Fetal Ultrasounds or other Referrals:  None Maternal Substance Abuse:  No Significant Maternal Medications:  None Significant Maternal Lab Results:  none Other Comments:  None  Review of Systems  Constitutional: Negative.   Gastrointestinal: Negative.   Genitourinary: Negative.    Maternal Medical History:  Reason for admission: Rupture of membranes.   Fetal activity: Perceived fetal activity is normal.   Last perceived fetal movement was within the past hour.    Prenatal complications: no prenatal complications   Dilation: Closed Blood pressure 105/76, pulse 82, temperature 98.6 F (37 C), temperature source Oral, resp. rate 18, height 5\' 1"  (1.549 m), weight 186 lb (84.4 kg), last menstrual period 10/22/2015. Maternal Exam:  Abdomen: Patient reports no abdominal tenderness. Fetal presentation: vertex  Introitus: Normal vulva. Normal vagina.  Ferning test: positive.  Nitrazine test: positive. Amniotic fluid character: clear.  Pelvis:  adequate for delivery.   Cervix: Cervix evaluated by digital exam.     Fetal Exam Fetal Monitor Review: Mode: ultrasound.   Variability: moderate (6-25 bpm).   Pattern: accelerations present and no decelerations.    Fetal State Assessment: Category I - tracings are normal.     Physical Exam  Constitutional: She is oriented to person, place, and time. She appears well-developed and well-nourished. No distress.  Eyes: No scleral icterus.  Neck: Normal range of motion. Neck supple.  Cardiovascular: Normal rate.   Respiratory: Effort normal. No respiratory distress.  GI: Soft. She exhibits no distension. There is no tenderness.  Genitourinary: Vagina normal and uterus normal.  Musculoskeletal: Normal range of motion.  Neurological: She is alert and oriented to person, place, and time.  Skin: Skin is warm and dry.  Psychiatric: She has a normal mood and affect.    Prenatal labs: ABO, Rh: AB positive Antibody: negative Rubella:  non immune RPR:   non reactive HBsAg:   neg HIV:   non reactive GBS:   pending Glucola 95  Assessment/Plan:  Admit to Antenatal Betamethasone x 2 doses Latency abx ordered GBS, GC/CT, HIV pending UDS pending Admission labs pending Formal Wet mount pending Magnesium sulfate for CP prophylaxis not indicated NICU consult placed Will recheck presentation if she does progress in preterm labor to determine route of delivery Routine antenatal care   Christeen DouglasBEASLEY, Alieah Brinton 06/09/2016, 2:56 PM

## 2016-06-10 ENCOUNTER — Inpatient Hospital Stay: Payer: Medicaid Other | Admitting: Anesthesiology

## 2016-06-10 ENCOUNTER — Encounter: Payer: Self-pay | Admitting: Anesthesiology

## 2016-06-10 LAB — RPR: RPR: NONREACTIVE

## 2016-06-10 MED ORDER — KETOROLAC TROMETHAMINE 30 MG/ML IJ SOLN: 30.0000 mg | Freq: Four times a day (QID) | INTRAMUSCULAR | Status: DC | PRN

## 2016-06-10 MED ORDER — ACETAMINOPHEN 325 MG PO TABS: 650.0000 mg | ORAL_TABLET | ORAL | Status: DC | PRN

## 2016-06-10 MED ORDER — ONDANSETRON HCL 4 MG PO TABS: 4.0000 mg | ORAL_TABLET | ORAL | Status: DC | PRN

## 2016-06-10 MED ORDER — WITCH HAZEL-GLYCERIN EX PADS: 1.0000 "application " | MEDICATED_PAD | CUTANEOUS | Status: DC | PRN

## 2016-06-10 MED ORDER — NALOXONE HCL 0.4 MG/ML IJ SOLN: 0.4000 mg | INTRAMUSCULAR | Status: DC | PRN

## 2016-06-10 MED ORDER — MEASLES, MUMPS & RUBELLA VAC ~~LOC~~ INJ
0.5000 mL | INJECTION | Freq: Once | SUBCUTANEOUS | Status: AC
  Administered 2016-06-11: 0.5 mL via SUBCUTANEOUS
  Filled 2016-06-10 (×2): qty 0.5

## 2016-06-10 MED ORDER — MAGNESIUM SULFATE BOLUS VIA INFUSION
4.0000 g | Freq: Once | INTRAVENOUS | Status: AC
  Administered 2016-06-10: 4 g via INTRAVENOUS
  Filled 2016-06-10: qty 500

## 2016-06-10 MED ORDER — OXYTOCIN BOLUS FROM INFUSION
500.0000 mL | Freq: Once | INTRAVENOUS | Status: AC
  Administered 2016-06-10: 500 mL via INTRAVENOUS

## 2016-06-10 MED ORDER — FENTANYL 2.5 MCG/ML W/ROPIVACAINE 0.2% IN NS 100 ML EPIDURAL INFUSION (ARMC-ANES)
EPIDURAL | Status: AC
  Filled 2016-06-10: qty 100

## 2016-06-10 MED ORDER — OXYTOCIN 40 UNITS IN LACTATED RINGERS INFUSION - SIMPLE MED
INTRAVENOUS | Status: AC
  Administered 2016-06-10: 500 mL via INTRAVENOUS
  Filled 2016-06-10: qty 1000

## 2016-06-10 MED ORDER — LIDOCAINE HCL (PF) 1 % IJ SOLN
INTRAMUSCULAR | Status: DC | PRN
  Administered 2016-06-10: 3 mL via SUBCUTANEOUS

## 2016-06-10 MED ORDER — MAGNESIUM SULFATE 50 % IJ SOLN
2.0000 g/h | INTRAVENOUS | Status: DC
  Administered 2016-06-10: 4 g/h via INTRAVENOUS
  Filled 2016-06-10: qty 80

## 2016-06-10 MED ORDER — FENTANYL 2.5 MCG/ML W/ROPIVACAINE 0.2% IN NS 100 ML EPIDURAL INFUSION (ARMC-ANES)
EPIDURAL | Status: DC | PRN
  Administered 2016-06-10: 10 mL/h via EPIDURAL

## 2016-06-10 MED ORDER — BENZOCAINE-MENTHOL 20-0.5 % EX AERO: 1.0000 "application " | INHALATION_SPRAY | CUTANEOUS | Status: DC | PRN

## 2016-06-10 MED ORDER — MEPERIDINE HCL 25 MG/ML IJ SOLN: 6.2500 mg | INTRAMUSCULAR | Status: DC | PRN

## 2016-06-10 MED ORDER — NALBUPHINE HCL 10 MG/ML IJ SOLN: 5.0000 mg | Freq: Once | INTRAMUSCULAR | Status: DC | PRN

## 2016-06-10 MED ORDER — MAGNESIUM SULFATE BOLUS VIA INFUSION
6.0000 g | Freq: Once | INTRAVENOUS | Status: DC
  Filled 2016-06-10: qty 500

## 2016-06-10 MED ORDER — NALBUPHINE HCL 10 MG/ML IJ SOLN: 5.0000 mg | INTRAMUSCULAR | Status: DC | PRN

## 2016-06-10 MED ORDER — SODIUM CHLORIDE 0.9 % IV SOLN: 250.0000 mL | INTRAVENOUS | Status: DC | PRN

## 2016-06-10 MED ORDER — SIMETHICONE 80 MG PO CHEW: 80.0000 mg | CHEWABLE_TABLET | ORAL | Status: DC | PRN

## 2016-06-10 MED ORDER — ZOLPIDEM TARTRATE 5 MG PO TABS: 5.0000 mg | ORAL_TABLET | Freq: Every evening | ORAL | Status: DC | PRN

## 2016-06-10 MED ORDER — FENTANYL 2.5 MCG/ML W/ROPIVACAINE 0.2% IN NS 100 ML EPIDURAL INFUSION (ARMC-ANES): 10.0000 mL/h | EPIDURAL | Status: DC

## 2016-06-10 MED ORDER — DIBUCAINE 1 % RE OINT: 1.0000 "application " | TOPICAL_OINTMENT | RECTAL | Status: DC | PRN

## 2016-06-10 MED ORDER — LIDOCAINE-EPINEPHRINE (PF) 1.5 %-1:200000 IJ SOLN
INTRAMUSCULAR | Status: DC | PRN
  Administered 2016-06-10: 3 mL via EPIDURAL

## 2016-06-10 MED ORDER — IBUPROFEN 600 MG PO TABS
600.0000 mg | ORAL_TABLET | Freq: Four times a day (QID) | ORAL | Status: DC
  Administered 2016-06-10 – 2016-06-11 (×3): 600 mg via ORAL
  Filled 2016-06-10 (×3): qty 1

## 2016-06-10 MED ORDER — BISACODYL 10 MG RE SUPP: 10.0000 mg | Freq: Every day | RECTAL | Status: DC | PRN

## 2016-06-10 MED ORDER — SODIUM CHLORIDE 0.9% FLUSH: 3.0000 mL | Freq: Two times a day (BID) | INTRAVENOUS | Status: DC

## 2016-06-10 MED ORDER — DIPHENHYDRAMINE HCL 25 MG PO CAPS: 25.0000 mg | ORAL_CAPSULE | Freq: Four times a day (QID) | ORAL | Status: DC | PRN

## 2016-06-10 MED ORDER — ONDANSETRON HCL 4 MG/2ML IJ SOLN: 4.0000 mg | Freq: Three times a day (TID) | INTRAMUSCULAR | Status: DC | PRN

## 2016-06-10 MED ORDER — DIPHENHYDRAMINE HCL 50 MG/ML IJ SOLN: 12.5000 mg | INTRAMUSCULAR | Status: DC | PRN

## 2016-06-10 MED ORDER — SODIUM CHLORIDE 0.9% FLUSH: 3.0000 mL | INTRAVENOUS | Status: DC | PRN

## 2016-06-10 MED ORDER — OXYCODONE-ACETAMINOPHEN 5-325 MG PO TABS
1.0000 | ORAL_TABLET | Freq: Four times a day (QID) | ORAL | Status: DC | PRN
  Administered 2016-06-10 – 2016-06-11 (×5): 2 via ORAL
  Filled 2016-06-10 (×5): qty 2

## 2016-06-10 MED ORDER — ONDANSETRON HCL 4 MG/2ML IJ SOLN: 4.0000 mg | INTRAMUSCULAR | Status: DC | PRN

## 2016-06-10 MED ORDER — OXYTOCIN 40 UNITS IN LACTATED RINGERS INFUSION - SIMPLE MED: 2.5000 [IU]/h | INTRAVENOUS | Status: DC

## 2016-06-10 MED ORDER — COCONUT OIL OIL: 1.0000 "application " | TOPICAL_OIL | Status: DC | PRN

## 2016-06-10 MED ORDER — FLEET ENEMA 7-19 GM/118ML RE ENEM: 1.0000 | ENEMA | Freq: Every day | RECTAL | Status: DC | PRN

## 2016-06-10 MED ORDER — DIPHENHYDRAMINE HCL 25 MG PO CAPS: 25.0000 mg | ORAL_CAPSULE | ORAL | Status: DC | PRN

## 2016-06-10 MED ORDER — MAGNESIUM SULFATE 50 % IJ SOLN: 2.0000 g/h | INTRAVENOUS | Status: DC

## 2016-06-10 MED ORDER — NALOXONE HCL 2 MG/2ML IJ SOSY
1.0000 ug/kg/h | PREFILLED_SYRINGE | INTRAVENOUS | Status: DC | PRN
  Filled 2016-06-10: qty 2

## 2016-06-10 MED ORDER — SODIUM CHLORIDE 0.9 % IV SOLN
INTRAVENOUS | Status: DC | PRN
  Administered 2016-06-10 (×2): 4 mL via EPIDURAL

## 2016-06-10 MED ORDER — TETANUS-DIPHTH-ACELL PERTUSSIS 5-2.5-18.5 LF-MCG/0.5 IM SUSP: 0.5000 mL | Freq: Once | INTRAMUSCULAR | Status: DC

## 2016-06-10 MED ORDER — LACTATED RINGERS IV BOLUS (SEPSIS)
500.0000 mL | Freq: Once | INTRAVENOUS | Status: AC
  Administered 2016-06-10: 500 mL via INTRAVENOUS

## 2016-06-10 MED ORDER — SENNOSIDES-DOCUSATE SODIUM 8.6-50 MG PO TABS
2.0000 | ORAL_TABLET | ORAL | Status: DC
  Administered 2016-06-10: 2 via ORAL
  Filled 2016-06-10: qty 2

## 2016-06-10 MED ORDER — PRENATAL MULTIVITAMIN CH
1.0000 | ORAL_TABLET | Freq: Every day | ORAL | Status: DC
  Administered 2016-06-10 – 2016-06-11 (×2): 1 via ORAL
  Filled 2016-06-10 (×2): qty 1

## 2016-06-10 NOTE — Progress Notes (Signed)
Patient ID: Shelby MerinoLatonya D Hoffman, female   DOB: 1980/04/09, 36 y.o.   MRN: [id number]  36 y.o. female [id number] at 33+1wks presenting for PPROM 20 hours ago. She is receiving latency abx. She received her first dose of BMZ at 1516.   She began having painful contractions at about 0200. After reviewing the available guidelines, in the absence of signs/sx of chorio (afebrile, normal WBC) I will start magnesium iv for short term tocolysis to attempt the full 48hours of steriods. I will check her cervix if she continues ctx or something else clinically changes. We will move her to a labor bed.

## 2016-06-10 NOTE — Anesthesia Procedure Notes (Signed)
Epidural Patient location during procedure: OB Start time: 06/10/2016 5:03 AM End time: 06/10/2016 5:13 AM  Staffing Anesthesiologist: Lenard SimmerKARENZ, Heide Brossart Performed: anesthesiologist   Preanesthetic Checklist Completed: patient identified, site marked, surgical consent, pre-op evaluation, timeout performed, IV checked, risks and benefits discussed and monitors and equipment checked  Epidural Patient position: sitting Prep: ChloraPrep Patient monitoring: heart rate, continuous pulse ox and blood pressure Approach: midline Location: L3-L4 Injection technique: LOR saline  Needle:  Needle type: Tuohy  Needle gauge: 17 G Needle length: 9 cm and 9 Needle insertion depth: 5 cm Catheter type: closed end flexible Catheter size: 19 Gauge Catheter at skin depth: 9.5 cm Test dose: negative and 1.5% lidocaine with Epi 1:200 K  Assessment Sensory level: T10 Events: blood not aspirated, injection not painful, no injection resistance, negative IV test and no paresthesia  Additional Notes Pt. Evaluated and documentation done after procedure finished. Patient identified. Risks/Benefits/Options discussed with patient including but not limited to bleeding, infection, nerve damage, paralysis, failed block, incomplete pain control, headache, blood pressure changes, nausea, vomiting, reactions to medication both or allergic, itching and postpartum back pain. Confirmed with bedside nurse the patient's most recent platelet count. Confirmed with patient that they are not currently taking any anticoagulation, have any bleeding history or any family history of bleeding disorders. Patient expressed understanding and wished to proceed. All questions were answered. Sterile technique was used throughout the entire procedure. Please see nursing notes for vital signs. Test dose was given through epidural catheter and negative prior to continuing to dose epidural or start infusion. Warning signs of high block given to the  patient including shortness of breath, tingling/numbness in hands, complete motor block, or any concerning symptoms with instructions to call for help. Patient was given instructions on fall risk and not to get out of bed. All questions and concerns addressed with instructions to call with any issues or inadequate analgesia.   Patient tolerated the insertion well without immediate complications.Reason for block:procedure for pain

## 2016-06-10 NOTE — Plan of Care (Signed)
Problem: Role Relationship: Goal: Ability to demonstrate positive interaction with newborn will improve Outcome: Completed/Met Date Met: 06/10/16 Goes to visit infant in SCN.

## 2016-06-10 NOTE — Discharge Summary (Signed)
Obstetrical Discharge Summary  Patient Name: Shelby Hoffman DOB: Aug 21, 1980 MRN: [id number]  Date of Admission: 06/09/2016 Date of Discharge: 06/11/2016  Primary OB:  Phineas Realharles Drew  Gestational Age at Delivery: 3345w1d   Antepartum complications: Rubella non-immune Admitting Diagnosis: PPROM Secondary Diagnosis: Patient Active Problem List   Diagnosis Date Noted  . Premature rupture of membranes (PROM) with labor delayed by therapy 06/09/2016    Augmentation: none Complications: ROM>24 hours Intrapartum complications/course: 36 y.o.femaleG6P4014 at 33+1wks presenting for PPROM 24 hours ago. She was started on latency abx and received her first dose of BMZ at 1516, 16 hrs ago.  She began cramping painfully in the middle of the night and began to make cervical change. 4g/2g mag started for short term tocolysis, but she progressed quickly to fully dilated. She did receive an epidural. She pushed once over an intact perineum and delivered a viable baby boy, who cried at the perineum. We did 60sec of delayed cord clamping, and mom cut the cord. He was passed to awaiting NICU staff. Cord gases were collected. The placenta delivered spontaneously and was intact. Placenta sent to pathology. EBL was minimal, fundus was firm.  Date of Delivery: 06/10/16 Delivered By: Christeen DouglasBethany Beasley, MD Delivery Type: spontaneous vaginal delivery Anesthesia: epidural Placenta: Spontaneous Laceration: none Episiotomy: none Newborn Data: Live born female  Birth Weight:  2090 APGAR: 8,9    Discharge Physical Exam:  BP 107/61 (BP Location: Left Arm)   Pulse 66   Temp 98.5 F (36.9 C) (Oral)   Resp 20   Ht 5\' 1"  (1.549 m)   Wt 84.4 kg (186 lb)   LMP 10/22/2015   SpO2 94%   Breastfeeding? Unknown   BMI 35.14 kg/m   General: NAD CV: RRR Pulm: CTABL, nl effort ABD: s/nd/nt, fundus firm and below the umbilicus Lochia: moderate DVT Evaluation: LE non-ttp, no evidence of DVT on exam.  Hemoglobin   Date Value Ref Range Status  06/11/2016 10.5 (L) 12.0 - 16.0 g/dL Final   HGB  Date Value Ref Range Status  01/20/2013 12.4 12.0 - 16.0 g/dL Final   HCT  Date Value Ref Range Status  06/11/2016 30.7 (L) 35.0 - 47.0 % Final  01/20/2013 36.3 35.0 - 47.0 % Final    Post partum course: uneventful. By time of discharge she was meeting all goals Postpartum Procedures: none Disposition: stable, discharge to home. Baby Feeding: formula Baby Disposition: NICU  Rh Immune globulin given: no Rubella vaccine given: YES Tdap vaccine given in AP or PP setting: AP Flu vaccine given in AP or PP setting: unk  Contraception: IUD at postpartum visit  Prenatal Labs:  Rubella non-immune, AB positive, Hep B neg, Varicella immune,    Plan:  Rexanne ManoLatonya D Hoffman was discharged to home in good condition. [facility] with Dr. Dalbert GarnetBeasley in 6 weeks.  Call sooner with concerns.  Discharge Medications: Allergies as of 06/11/2016      Reactions   Tomato       Medication List    STOP taking these medications   cyclobenzaprine 10 MG tablet Commonly known as:  FLEXERIL   HYDROcodone-acetaminophen 5-325 MG tablet Commonly known as:  NORCO     TAKE these medications   acetaminophen 500 MG tablet Commonly known as:  TYLENOL Take 1 tablet (500 mg total) by mouth every 6 (six) hours as needed for moderate pain.   ibuprofen 800 MG tablet Commonly known as:  ADVIL,MOTRIN Take 1 tablet (800 mg total) by  mouth every 8 (eight) hours as needed.   multivitamin-prenatal 27-0.8 MG Tabs tablet Take 1 tablet by mouth daily at 12 noon.       Follow-up Information    Christeen Douglas, MD Follow up in 6 week(s).   Specialty:  Obstetrics and Gynecology Contact information: [address] [place] [address] [phone]           Signed: Elenora Fender Batu Cassin 06/11/16

## 2016-06-10 NOTE — Anesthesia Preprocedure Evaluation (Signed)
Anesthesia Evaluation  Patient identified by MRN, date of birth, ID band Patient awake    Reviewed: Allergy & Precautions, H&P , NPO status , Patient's Chart, lab work & pertinent test results, reviewed documented beta blocker date and time   History of Anesthesia Complications Negative for: history of anesthetic complications  Airway Mallampati: II  TM Distance: >3 FB Neck ROM: full    Dental  (+) Teeth Intact, Missing   Pulmonary neg pulmonary ROS,           Cardiovascular Exercise Tolerance: Good negative cardio ROS       Neuro/Psych negative neurological ROS  negative psych ROS   GI/Hepatic Neg liver ROS, GERD  ,  Endo/Other  negative endocrine ROS  Renal/GU negative Renal ROS  negative genitourinary   Musculoskeletal   Abdominal   Peds  Hematology negative hematology ROS (+)   Anesthesia Other Findings Past Medical History: No date: Anxiety   Reproductive/Obstetrics (+) Pregnancy                             Anesthesia Physical Anesthesia Plan  ASA: II  Anesthesia Plan: Epidural   Post-op Pain Management:    Induction:   Airway Management Planned:   Additional Equipment:   Intra-op Plan:   Post-operative Plan:   Informed Consent: I have reviewed the patients History and Physical, chart, labs and discussed the procedure including the risks, benefits and alternatives for the proposed anesthesia with the patient or authorized representative who has indicated his/her understanding and acceptance.   Dental Advisory Given  Plan Discussed with: Anesthesiologist, CRNA and Surgeon  Anesthesia Plan Comments:         Anesthesia Quick Evaluation

## 2016-06-11 LAB — CBC
HEMATOCRIT: 30.7 % — AB (ref 35.0–47.0)
Hemoglobin: 10.5 g/dL — ABNORMAL LOW (ref 12.0–16.0)
MCH: 31.8 pg (ref 26.0–34.0)
MCHC: 34.3 g/dL (ref 32.0–36.0)
MCV: 92.8 fL (ref 80.0–100.0)
Platelets: 185 10*3/uL (ref 150–440)
RBC: 3.31 MIL/uL — AB (ref 3.80–5.20)
RDW: 14.6 % — AB (ref 11.5–14.5)
WBC: 8.4 10*3/uL (ref 3.6–11.0)

## 2016-06-11 LAB — CULTURE, BETA STREP (GROUP B ONLY)

## 2016-06-11 MED ORDER — IBUPROFEN 800 MG PO TABS: 800.0000 mg | ORAL_TABLET | Freq: Three times a day (TID) | ORAL | 0 refills | Status: DC | PRN

## 2016-06-11 MED ORDER — IBUPROFEN 600 MG PO TABS
600.0000 mg | ORAL_TABLET | Freq: Four times a day (QID) | ORAL | Status: DC
  Administered 2016-06-11 (×2): 600 mg via ORAL
  Filled 2016-06-11 (×2): qty 1

## 2016-06-11 NOTE — Progress Notes (Signed)
Mother and FOB both viewed the video, "The Period of Purple Cry" prior to discharge home.  CD also given to pt to take home for other caregivers as well.

## 2016-06-11 NOTE — Discharge Instructions (Signed)

## 2016-06-11 NOTE — Anesthesia Postprocedure Evaluation (Signed)
Anesthesia Post Note  Patient: Shelby Hoffman  Procedure(s) Performed: * No procedures listed *  Patient location during evaluation: Mother Baby Anesthesia Type: Epidural Level of consciousness: awake, awake and alert and oriented Pain management: pain level controlled Vital Signs Assessment: post-procedure vital signs reviewed and stable Respiratory status: spontaneous breathing, nonlabored ventilation and respiratory function stable Cardiovascular status: blood pressure returned to baseline and stable Postop Assessment: no headache and no backache Anesthetic complications: no     Last Vitals:  Vitals:   06/11/16 0323 06/11/16 0742  BP: (!) 95/55 107/61  Pulse: 79 66  Resp:  20  Temp: 36.6 C 36.9 C    Last Pain:  Vitals:   06/11/16 0742  TempSrc: Oral  PainSc:                  Ginger CarneStephanie Yovanna Cogan

## 2016-06-11 NOTE — Progress Notes (Signed)
MD order for pt discharge home.  Pt and sig other given all d/c instructions and understand all. Pt ambulated out of hospital per preference.  Baby to remain in SCN.

## 2016-06-11 NOTE — Discharge Planning (Signed)
Interdisciplinary rounds held this morning. Present included Neonatology, PT,OT, Nursing,NICU Dietitian.Infant remains on radiant warmer on RA with stable VS. Feeds started yesterday, IVF continue. Bili level ok, will repeat in AM. Mom does not want to breast feed, will use EPF24. Family in to visit, questions answered and updates given.

## 2016-06-12 LAB — SURGICAL PATHOLOGY

## 2017-01-17 IMAGING — US US OB COMP LESS 14 WK
1 series · 13 of 28 positions shown · non-contrast
Comparison: 12/16/2015

CLINICAL DATA: Current assigned gestational age of 13 weeks 1 day.
Followup complex right ovarian cystic lesions.

EXAM:
OBSTETRIC <14 WK US AND TRANSVAGINAL OB US
TECHNIQUE: Both transabdominal and transvaginal ultrasound examinations were
performed for complete evaluation of the gestation as well as the
maternal uterus, adnexal regions, and pelvic cul-de-sac.
Transvaginal technique was performed to assess early pregnancy.

[Series 1: us ob comp less 14 wk · 0.17mm/px · 13 of 108 slices shown]
[im 4/108]
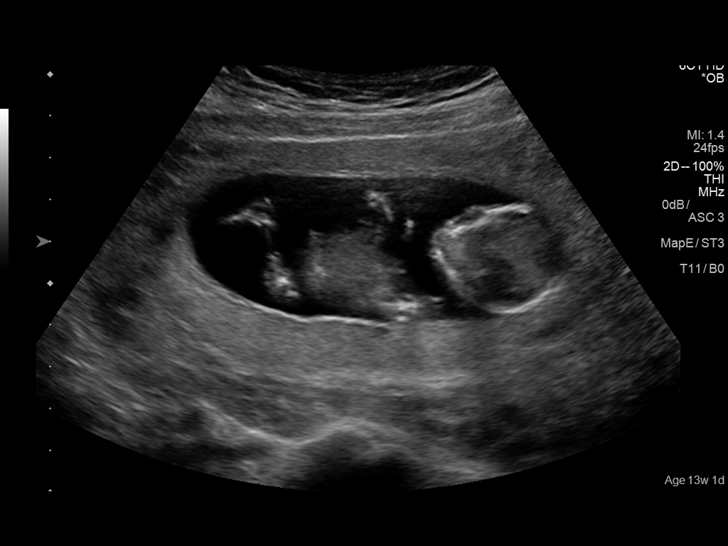
[im 12/108]
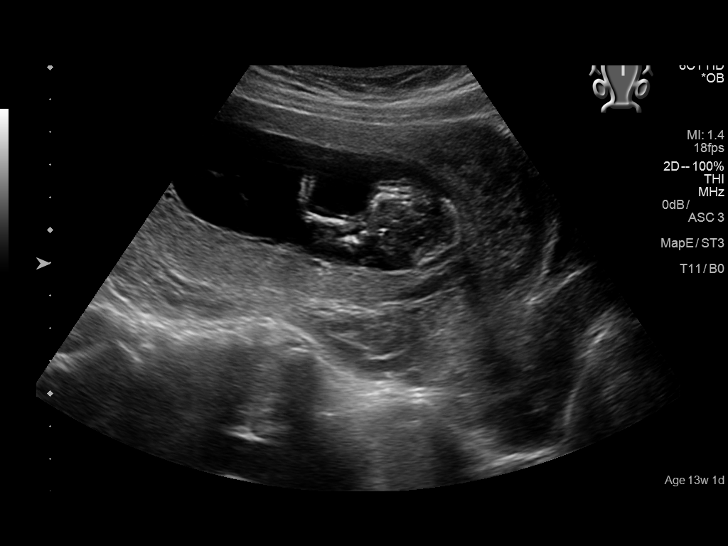
[im 20/108]
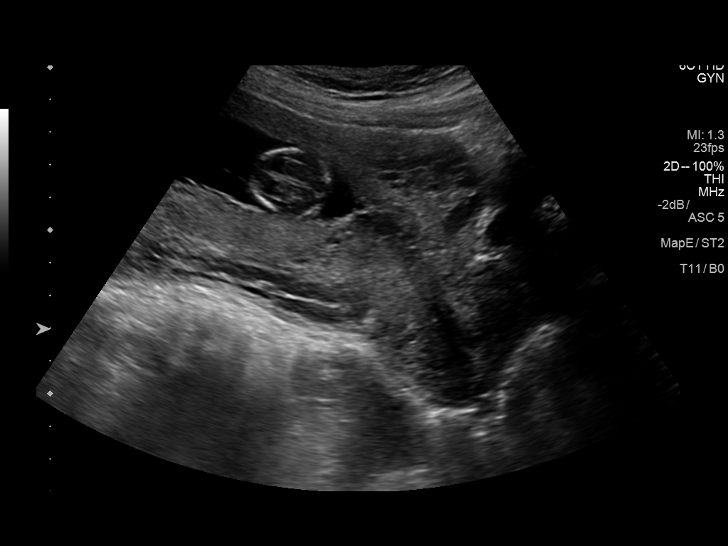
[im 28/108]
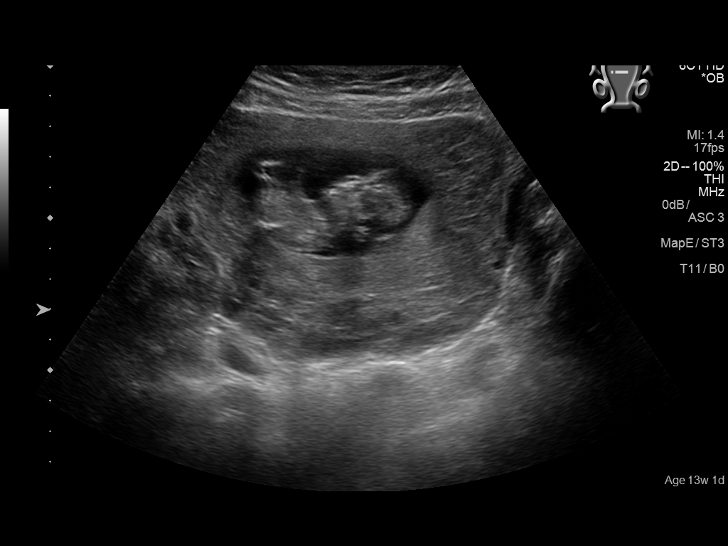
[im 36/108]
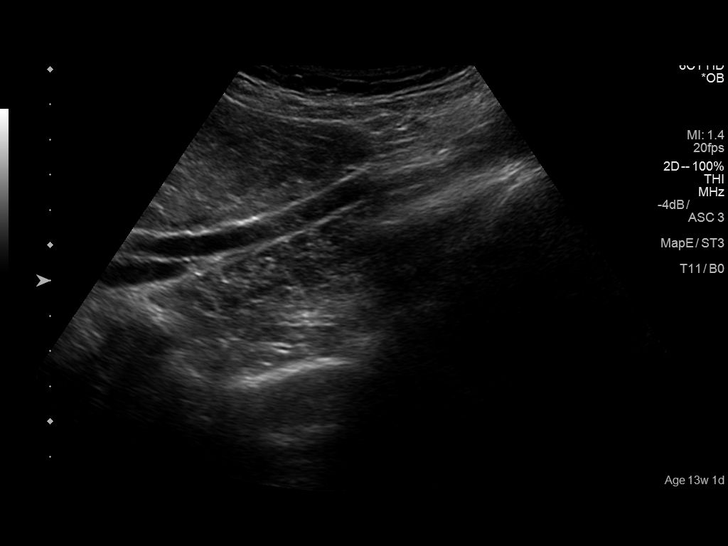
[im 44/108]
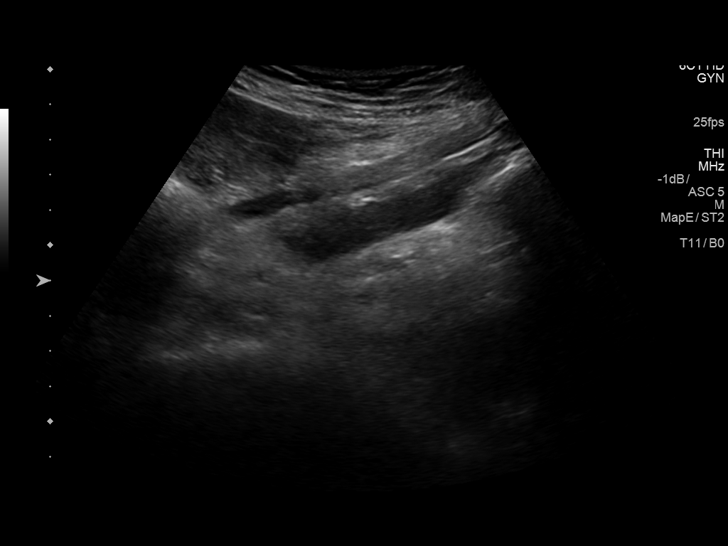
[im 56/108]
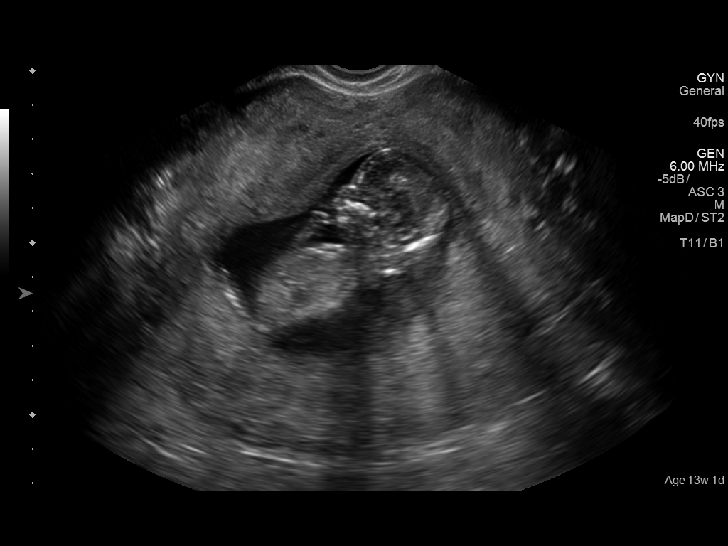
[im 64/108]
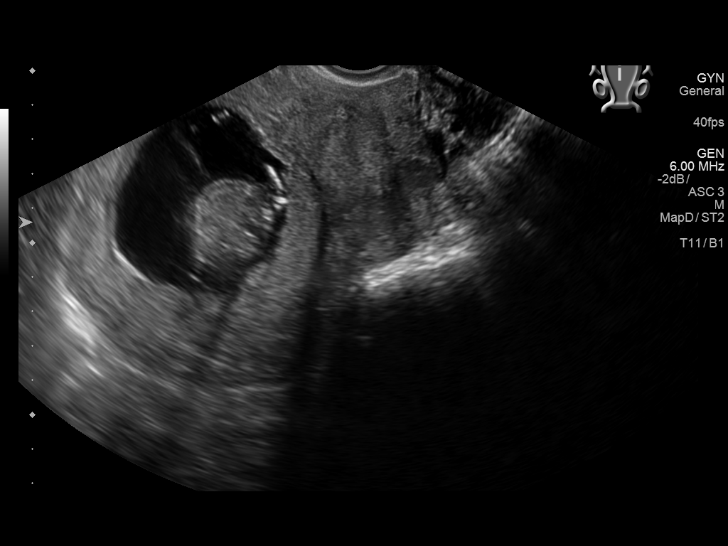
[im 72/108]
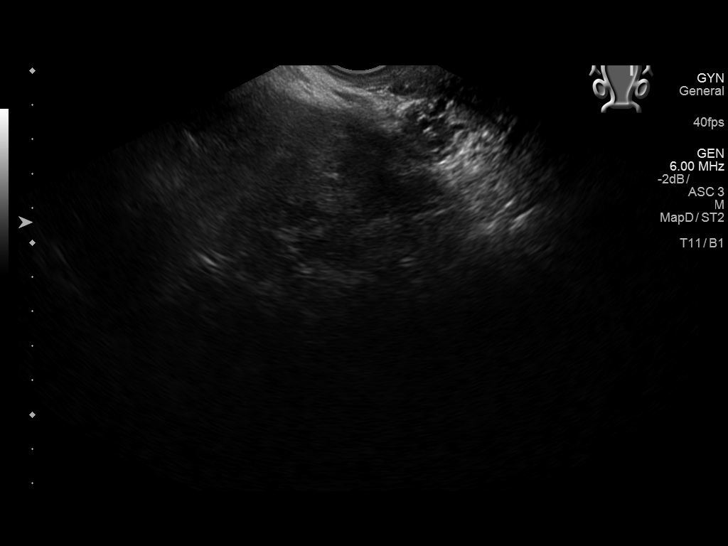
[im 80/108]
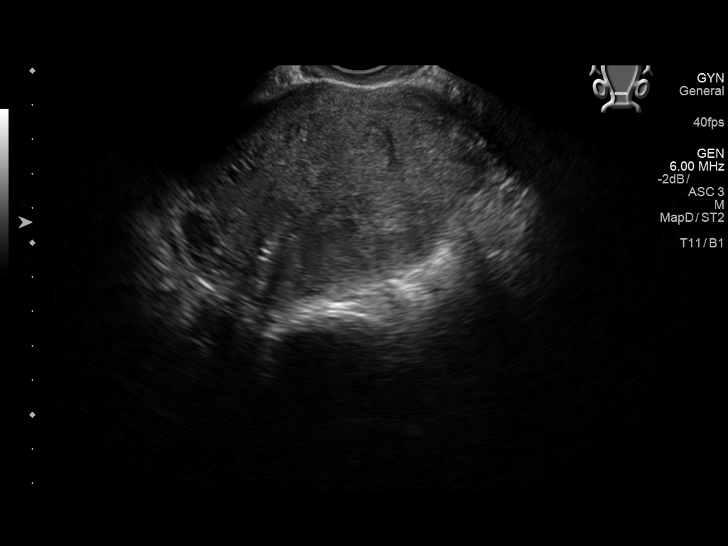
[im 88/108]
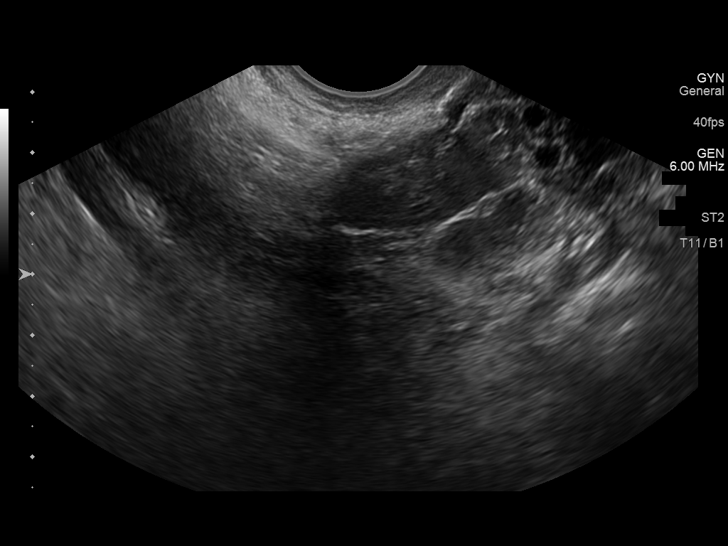
[im 96/108]
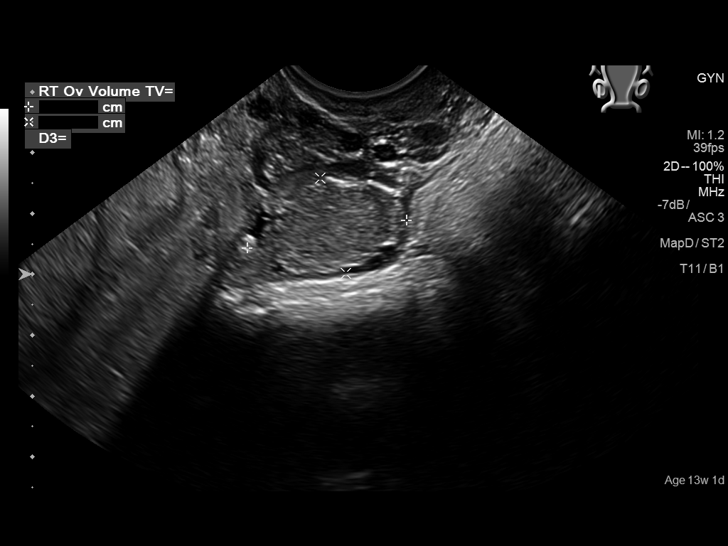
[im 104/108]
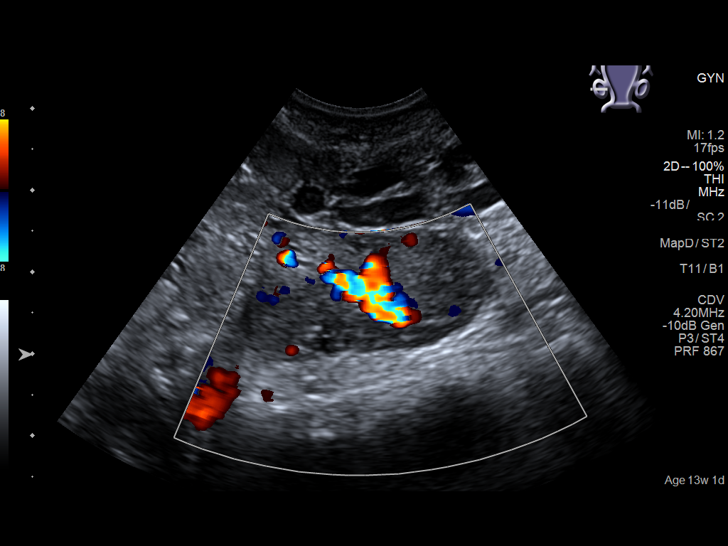

[13 of 28 positions shown; findings below may reference images not displayed]

FINDINGS: Intrauterine gestational sac: Single

Yolk sac:  Not Visualized.

Embryo:  Visualized.

Cardiac Activity: Visualized.

Heart Rate: 157  bpm

CRL:  74  mm   13 w   4 d                  US EDC: 07/25/2016

No abnormality seen involving visualized early anatomic landmarks.

Subchorionic hemorrhage:  None visualized.

Maternal uterus/adnexae: Small bilobed hypoechoic area in the right
ovary measures 1.0 cm and 1.5 cm, which is slightly decreased since
previous study. Peripheral blood flow seen on color Doppler
ultrasound but no internal blood flow seen. These are most
consistent with resolving corpus luteum. Left ovary is normal
appearance. No other adnexal mass or free fluid identified.
IMPRESSION: Single living IUP with assigned gestational age of 13 weeks 1 day by
prior ultrasound. Appropriate interval fetal growth.

Decreased size of bilobed hypoechoic area in the right ovary,
consistent with resolving corpus luteum. No suspicious masses or
free fluid identified.

## 2017-02-28 IMAGING — US US OB COMP +14 WK
1 series · 13 of 28 positions shown · non-contrast
Comparison: 01/22/2016

CLINICAL DATA: Current assigned gestational age of 19 weeks 1 day.
Evaluate fetal growth and anatomy.

EXAM:
OBSTETRICAL ULTRASOUND >14 WKS

[Series 1: us ob comp +14 wk · 0.23mm/px · 13 of 104 slices shown]
[im 4/104]
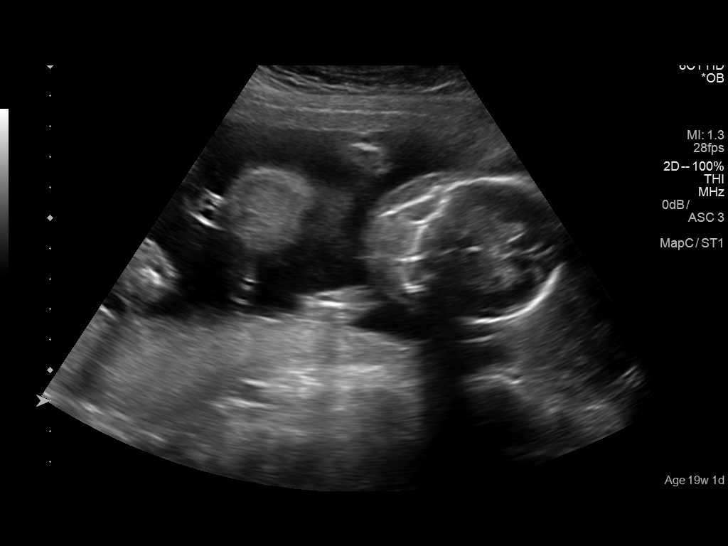
[im 12/104]
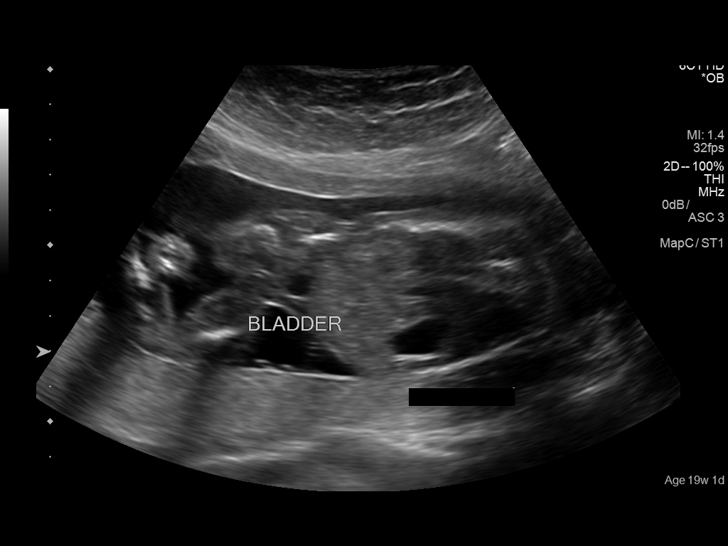
[im 20/104]
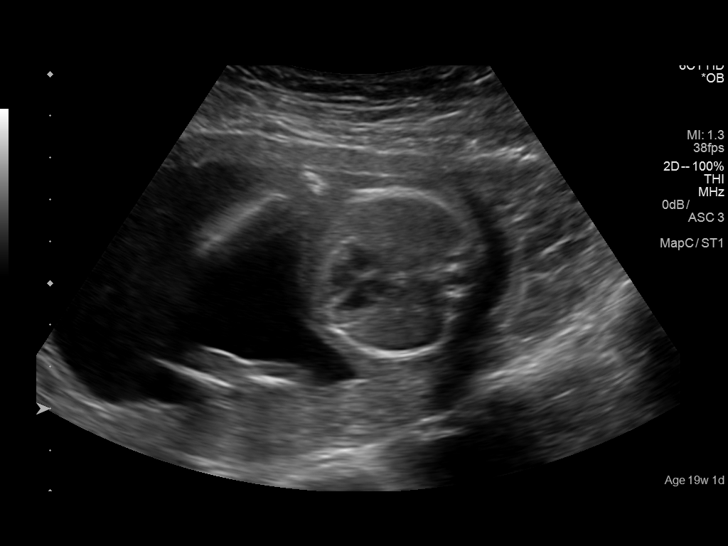
[im 27/104]
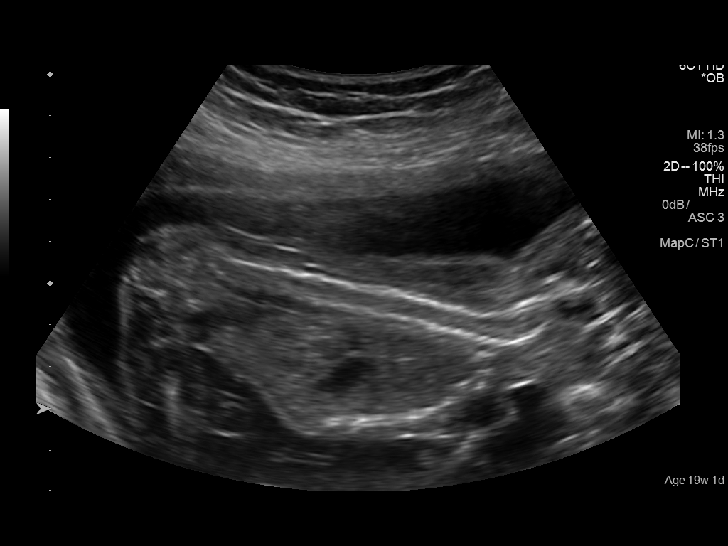
[im 35/104]
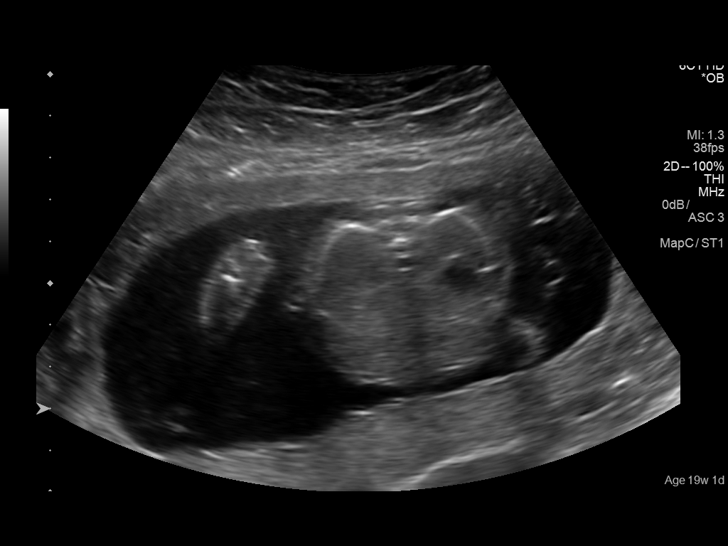
[im 42/104]
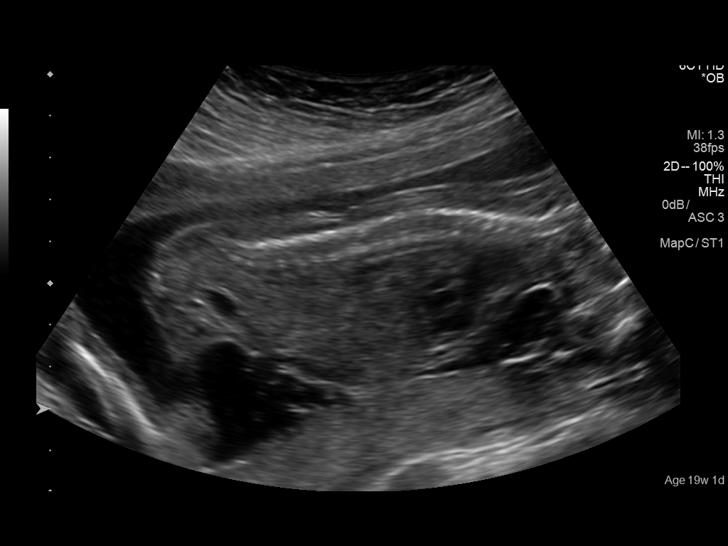
[im 54/104]
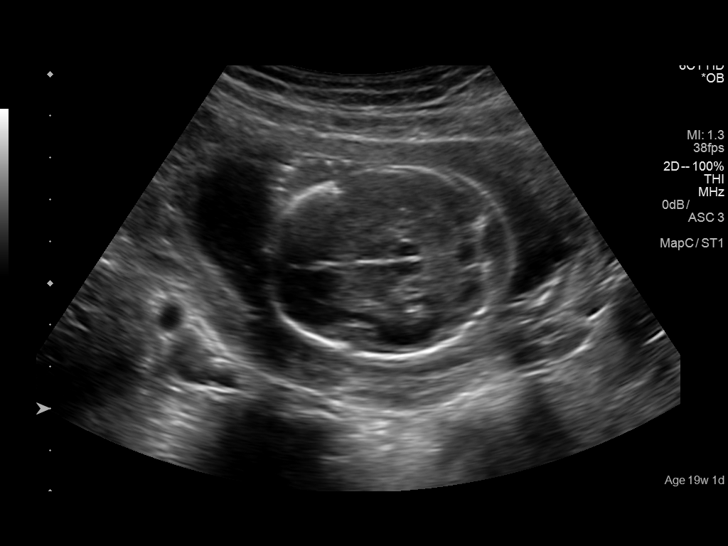
[im 62/104]
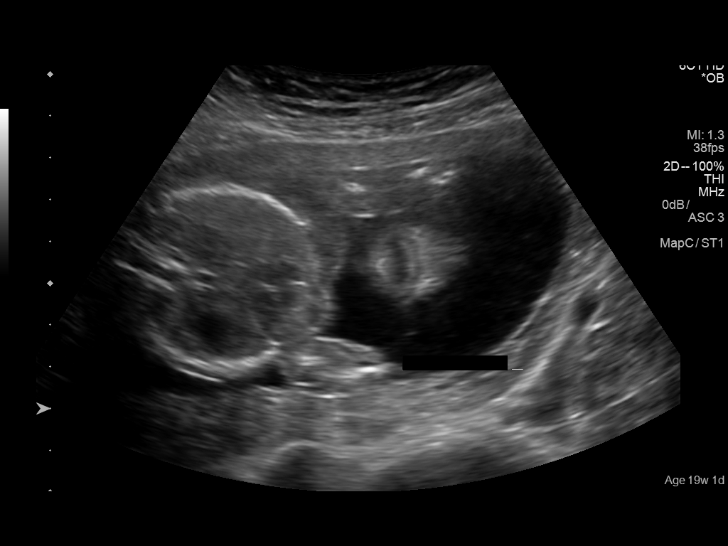
[im 69/104]
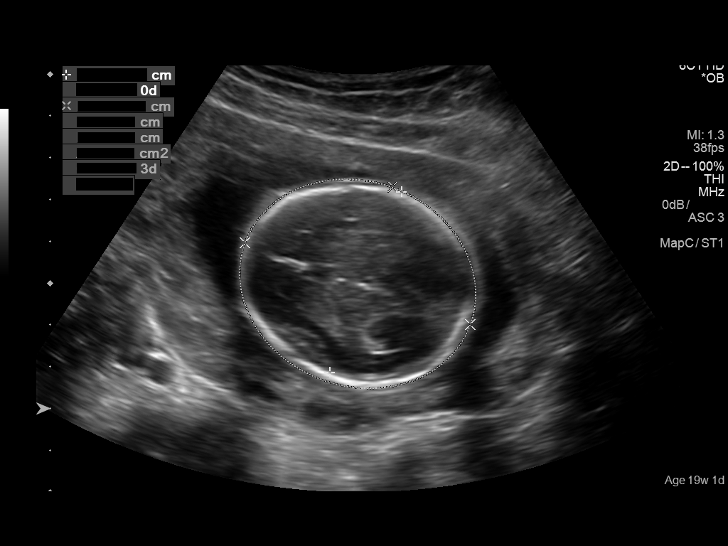
[im 77/104]
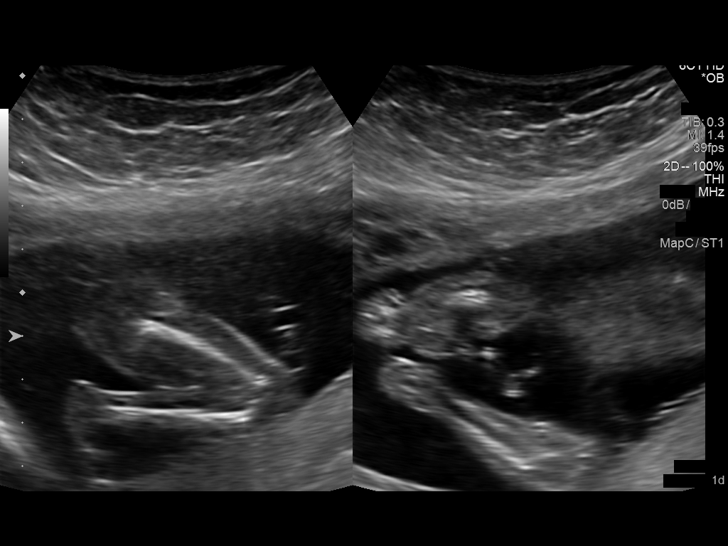
[im 84/104]
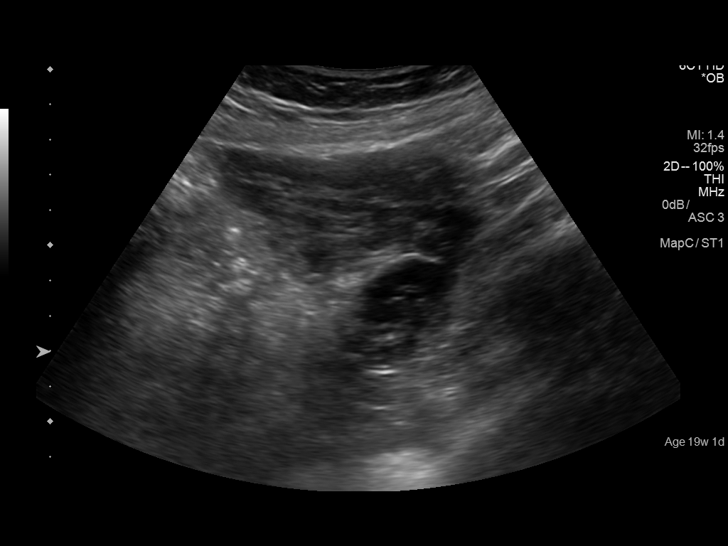
[im 92/104]
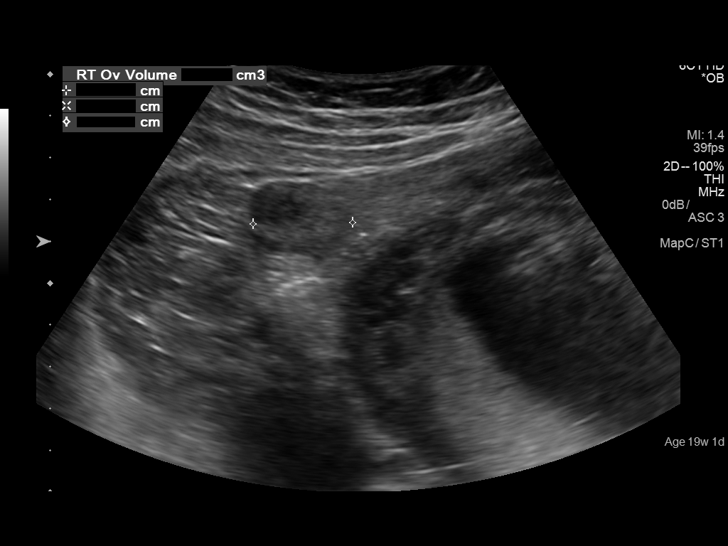
[im 100/104]
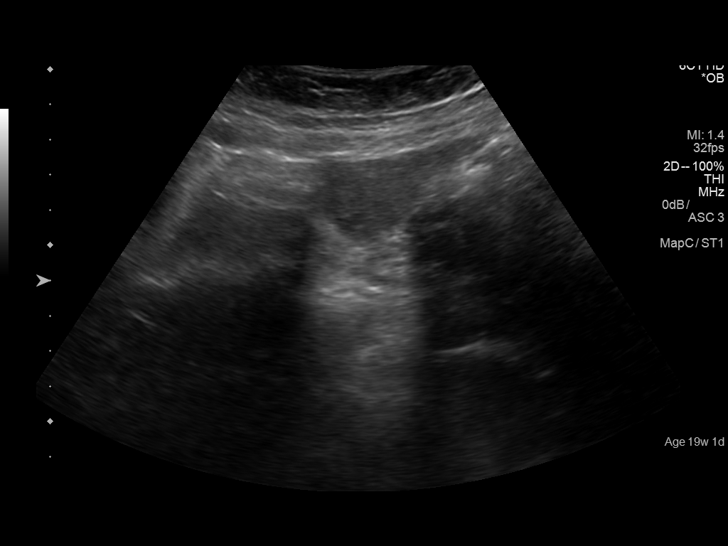

[13 of 28 positions shown; findings below may reference images not displayed]

FINDINGS: Number of Fetuses: 1

Heart Rate:  145 bpm

Movement: Yes

Presentation: Cephalic

Previa: No

Placental Location: Posterior

Amniotic Fluid (Subjective): Normal

Amniotic Fluid (Objective):

Vertical pocket 5.2cm

FETAL BIOMETRY

BPD:  4.6cm 19w 6d

HC:    16.8cm  19w   3d

AC:   14.9cm  20w   1d

FL:   3.1cm  19w   3d

Current Mean GA: 19w 5d              US EDC: 07/24/2016

FETAL ANATOMY

Lateral Ventricles: Appears normal

Thalami/CSP: Appears normal

Posterior Fossa:  Appears normal

Nuchal Region: Appears normal    NFT= 4.4mm

Upper Lip: Appears normal

Spine: Appears normal

4 Chamber Heart on Left: Appears normal

LVOT: Appears normal

RVOT: Appears normal

Stomach on Left: Appears normal

3 Vessel Cord: Appears normal

Cord Insertion site: Appears normal

Kidneys: Appears normal

Bladder: Appears normal

Extremities: Appears normal

Sex: Male

Maternal Findings:

Cervix:  3.6 cm transabdominally ; normal appearance of both ovaries
IMPRESSION: Assigned gestational age by early ultrasound is currently 19 weeks 1
day. Appropriate interval fetal growth.

No fetal anomalies seen involving visualized anatomy noted above.

## 2018-09-24 ENCOUNTER — Emergency Department
Admission: EM | Admit: 2018-09-24 | Discharge: 2018-09-24 | Disposition: A | Discharge status: Home / Self Care | Payer: Self-pay | Attending: Emergency Medicine | Admitting: Emergency Medicine

## 2018-09-24 ENCOUNTER — Emergency Department: Payer: Self-pay

## 2018-09-24 ENCOUNTER — Encounter: Payer: Self-pay | Admitting: Emergency Medicine

## 2018-09-24 ENCOUNTER — Other Ambulatory Visit: Payer: Self-pay

## 2018-09-24 DIAGNOSIS — M79603 Pain in arm, unspecified: Secondary | ICD-10-CM

## 2018-09-24 DIAGNOSIS — Z79899 Other long term (current) drug therapy: Secondary | ICD-10-CM | POA: Insufficient documentation

## 2018-09-24 DIAGNOSIS — M25519 Pain in unspecified shoulder: Secondary | ICD-10-CM

## 2018-09-24 DIAGNOSIS — I82621 Acute embolism and thrombosis of deep veins of right upper extremity: Secondary | ICD-10-CM | POA: Insufficient documentation

## 2018-09-24 LAB — CBC WITH DIFFERENTIAL/PLATELET
Abs Immature Granulocytes: 0.03 10*3/uL (ref 0.00–0.07)
Basophils Absolute: 0 10*3/uL (ref 0.0–0.1)
Basophils Relative: 0 %
Eosinophils Absolute: 0.1 10*3/uL (ref 0.0–0.5)
Eosinophils Relative: 1 %
HCT: 37.8 % (ref 36.0–46.0)
Hemoglobin: 12.4 g/dL (ref 12.0–15.0)
Immature Granulocytes: 0 %
Lymphocytes Relative: 27 %
Lymphs Abs: 2.3 10*3/uL (ref 0.7–4.0)
MCH: 31.2 pg (ref 26.0–34.0)
MCHC: 32.8 g/dL (ref 30.0–36.0)
MCV: 95 fL (ref 80.0–100.0)
Monocytes Absolute: 0.5 10*3/uL (ref 0.1–1.0)
Monocytes Relative: 6 %
Neutro Abs: 5.7 10*3/uL (ref 1.7–7.7)
Neutrophils Relative %: 66 %
Platelets: 221 10*3/uL (ref 150–400)
RBC: 3.98 MIL/uL (ref 3.87–5.11)
RDW: 13.4 % (ref 11.5–15.5)
WBC: 8.7 10*3/uL (ref 4.0–10.5)
nRBC: 0 % (ref 0.0–0.2)

## 2018-09-24 LAB — BASIC METABOLIC PANEL
Anion gap: 9 (ref 5–15)
BUN: 18 mg/dL (ref 6–20)
CO2: 23 mmol/L (ref 22–32)
Calcium: 9.2 mg/dL (ref 8.9–10.3)
Chloride: 105 mmol/L (ref 98–111)
Creatinine, Ser: 0.51 mg/dL (ref 0.44–1.00)
GFR calc Af Amer: >60 mL/min (ref >60)
GFR calc non Af Amer: >60 mL/min (ref >60)
Glucose, Bld: 99 mg/dL (ref 70–99)
Potassium: 4 mmol/L (ref 3.5–5.1)
Sodium: 137 mmol/L (ref 135–145)

## 2018-09-24 LAB — SEDIMENTATION RATE: Sed Rate: 56 mm/hr — ABNORMAL HIGH (ref 0–20)

## 2018-09-24 LAB — C-REACTIVE PROTEIN: CRP: 2.3 mg/dL — ABNORMAL HIGH (ref <1.0)

## 2018-09-24 MED ORDER — APIXABAN 5 MG PO TABS: ORAL_TABLET | ORAL | 0 refills | Status: AC

## 2018-09-24 MED ORDER — ENOXAPARIN SODIUM 100 MG/ML ~~LOC~~ SOLN
1.0000 mg/kg | Freq: Two times a day (BID) | SUBCUTANEOUS | Status: DC
  Administered 2018-09-24: 85 mg via SUBCUTANEOUS
  Filled 2018-09-24: qty 1

## 2018-09-24 NOTE — Discharge Instructions (Addendum)
Take the Eliquis (the blood thinner) starting tomorrow as prescribed until you follow-up with the vascular surgeon or your regular doctor.  Call the vascular surgery clinic first thing Monday to arrange for a follow-up the same day or the next day.  Let them know you were seen in the ER and instructed to follow-up on Monday.  Return to the ER immediately for new or worsening swelling to the right arm, weakness or numbness, shortness of breath, chest pain, lightheadedness, or any other new or worsening symptoms that concern you.

## 2018-09-24 NOTE — ED Triage Notes (Signed)
Pt to ED via POV c/o right shoulder pain and arm swelling. Pt has cut on her right index finger on 09/06/18 with a knife at work. Pt was seen and wound has healed but this morning she woke up with pain and swelling in her right index finger. Pain and swelling extends all the way up her arm. Pt took a course of amoxicillin when she cut her finger. Pt is in NAD.

## 2018-09-24 NOTE — ED Notes (Signed)
ED Provider at bedside. 

## 2018-09-24 NOTE — ED Notes (Signed)
Reviewed discharge instructions, follow-up care, and prescriptions with patient. Patient verbalized understanding of all information reviewed. Patient stable, with no distress noted at this time.    

## 2018-09-24 NOTE — ED Provider Notes (Signed)
[facility] Emergency Department Provider Note ____________________________________________   First MD Initiated Contact with Patient 09/24/18 1657     (approximate)  I have reviewed the triage vital signs and the nursing notes.   HISTORY  Chief Complaint Shoulder Pain    HPI Shelby Hoffman is a 38 y.o. female with PMH as noted below who presents with right arm pain, acute onset today, and mainly starting in the shoulder and radiating down the arm.  She states that the pain is worse when she moves it.  The patient had a cut at the base of her right index finger on 09/06/2018 and it was treated with Steri-Strips.  She was given an antibiotic (the patient believes it was amoxicillin).  She states she finished the 7-day course and it continued to hurt but otherwise was healing well.  She did not have the pain in the upper arm until today.  She had no new injuries.  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  Past Medical History:  Diagnosis Date  . Anxiety     Patient Active Problem List   Diagnosis Date Noted  . Premature rupture of membranes (PROM) with labor delayed by therapy 06/09/2016    Past Surgical History:  Procedure Laterality Date  . INDUCED ABORTION      Prior to Admission medications   Medication Sig Start Date End Date Taking? Authorizing Provider  acetaminophen (TYLENOL) 500 MG tablet Take 1 tablet (500 mg total) by mouth every 6 (six) hours as needed for moderate pain. 12/22/15   Hagler, Jami L, PA-C  apixaban (ELIQUIS) 5 MG TABS tablet Take 2 tablets (10 mg total) by mouth 2 (two) times daily for 7 days, THEN 1 tablet (5 mg total) 2 (two) times daily for 23 days. 09/24/18 10/24/18  Arta Silence, MD  ibuprofen (ADVIL,MOTRIN) 800 MG tablet Take 1 tablet (800 mg total) by mouth every 8 (eight) hours as needed.  06/11/16   Ward, Honor Loh, MD  Prenatal Vit-Fe Fumarate-FA (MULTIVITAMIN-PRENATAL) 27-0.8 MG TABS tablet Take 1 tablet by mouth daily at 12 noon.    [provider]    Allergies Tomato  Family History  Problem Relation Age of Onset  . Miscarriages / Stillbirths Sister   . Diabetes Maternal Grandmother   . Cancer Neg Hx   . Heart disease Neg Hx   . Kidney disease Neg Hx     Social History Social History   Tobacco Use  . Smoking status: Never Smoker  . Smokeless tobacco: Never Used  Substance Use Topics  . Alcohol use: No  . Drug use: No    Review of Systems  Constitutional: No fever/chills. Eyes: No redness. ENT: No sore throat. Cardiovascular: Denies chest pain. Respiratory: Denies shortness of breath. Gastrointestinal: No vomiting or diarrhea.  Genitourinary: Negative for dysuria.  Musculoskeletal: Negative for back pain.  Positive for right arm pain. Skin: Negative for rash. Neurological: Negative for headache.   ____________________________________________   PHYSICAL EXAM:  VITAL SIGNS: ED Triage Vitals  Enc Vitals Group     BP 09/24/18 1258 105/69     Pulse Rate 09/24/18 1258 61     Resp 09/24/18 1258 16     Temp 09/24/18 1258 98.5 F (36.9 C)     Temp Source 09/24/18 1258 Oral     SpO2 09/24/18 1258 99 %  Weight 09/24/18 1259 191 lb (86.6 kg)     Height 09/24/18 1259 5' 1"  (1.549 m)     Head Circumference --      Peak Flow --      Pain Score 09/24/18 1259 0     Pain Loc --      Pain Edu? --      Excl. in [place]? --     Constitutional: Alert and oriented. Well appearing and in no acute distress. Eyes: Conjunctivae are normal.  Head: Atraumatic. Nose: No congestion/rhinnorhea. Mouth/Throat: Mucous membranes are moist.   Neck: Normal range of motion.  Cardiovascular: Normal rate, regular rhythm. Good peripheral circulation. Respiratory: Normal respiratory effort.  Gastrointestinal:  No distention.  Musculoskeletal: Extremities warm  and well perfused.  Dorsal right MCP joint area with healed laceration.  Full range of motion at that joint and the entire finger and hand with no erythema, induration, or abnormal swelling.  There is no erythema or streaking up the arm.  The patient has full range of motion of the elbow.  She has tenderness in the medial upper arm and axilla and pain on some range of motion especially abduction of the right shoulder.  No bony tenderness or visible trauma. Neurologic:  Normal speech and language. No gross focal neurologic deficits are appreciated.  Skin:  Skin is warm and dry. No rash noted. Psychiatric: Mood and affect are normal. Speech and behavior are normal.  ____________________________________________   LABS (all labs ordered are listed, but only abnormal results are displayed)  Labs Reviewed  SEDIMENTATION RATE - Abnormal; Notable for the following components:      Result Value   Sed Rate 56 (*)    All other components within normal limits  C-REACTIVE PROTEIN - Abnormal; Notable for the following components:   CRP 2.3 (*)    All other components within normal limits  CULTURE, BLOOD (ROUTINE X 2)  CULTURE, BLOOD (ROUTINE X 2)  CBC WITH DIFFERENTIAL/PLATELET  BASIC METABOLIC PANEL   ____________________________________________  EKG   ____________________________________________  RADIOLOGY  XR shoulder: No acute abnormality XR R hand: No acute abnormality US venous RUE: Acute DVT in the subclavian, brachial, and axillary veins ____________________________________________   PROCEDURES  Procedure(s) performed: No  Procedures  Critical Care performed: No ____________________________________________   INITIAL IMPRESSION / ASSESSMENT AND PLAN / ED COURSE  Pertinent labs & imaging results that were available during my care of the patient were reviewed by me and considered in my medical decision making (see chart for details).  38 year old female presents with acute  onset of right arm pain radiating from the shoulder down today.  She had a laceration at the base of her right index finger a few weeks ago and it was repaired at an outside urgent care.  She was put on a 7-day course of amoxicillin or Augmentin.  She states that she had some persistent pain there but it is generally healed well.  She denies pain going up the arm from the hand.  On exam the patient is overall well-appearing and her vital signs are normal.  The laceration at the base of the finger appears to be healing well and she has full range of motion at that joint and all the joints of the finger and hand.  There is no streaking or erythema going up the arm.  She does have some tenderness over the medial upper arm in the lymphatic chain and in the axilla and pain on range of motion of  the shoulder.  Overall given that the patient had just started really fully using her right arm again and does a lot of manual work, I suspect that she may have a soft tissue or overuse injury of the right shoulder causing some localized inflammation and it may have nothing to do with the laceration.  It is also possible that the patient does have a soft tissue infection related to the wound with some lymphatic reaction although I think that this is far less likely given that the wound itself and everything distal to the elbow appears normal.  Differential also includes DVT although the patient has no specific risk factors for this.  Lab work-up is significant only for elevated ESR.  We will obtain x-rays of the hand, shoulder, and a right upper extremity ultrasound.  ----------------------------------------- 9:10 PM on 09/24/2018 -----------------------------------------  X-rays are negative.  Ultrasound shows acute DVT in multiple vessels of the right upper extremity.  I suspect that this most likely is precipitated by the patient's use of a contraceptive implant as she has no other specific DVT risk factors.  I  consulted Dr. Trula Slade from vascular surgery and discussed the case with him over the phone.  He advises that if the patient does not have marked swelling, she could be given Lovenox here, started on Eliquis or Xarelto and could follow-up in the vascular surgery clinic on Monday (in 2 days) for evaluation for possible procedural intervention, as this would not need to be done emergently.  He advised that if the patient did have significant swelling or felt more comfortable, it would also be reasonable to admit her for anticoagulation and monitoring.  I discussed the results of the work-up extensively with the patient.  I specifically discussed the risks of DVT including circulatory problems in the arm as well as pulmonary embolism which can be life-threatening.  I gave her the options of discharge with oral anticoagulation and close follow-up in 2 days, versus admission.  The patient has a strong preference to go home and she understands the risks and is able to paraphrase them back to me.  I specifically advised her on the importance of getting the Eliquis prescription tomorrow and instructed her to return to the ER immediately if she is unable to obtain it or cannot afford it, although it should be covered by her Medicaid.  She also has that she needs to follow-up on Monday at the vascular surgery clinic as recommended by Dr. Trula Slade.  I answered all of her questions to the best of my ability.  Return precautions given, and she expressed understanding.  She is stable for discharge at this time.  We will give a dose of Lovenox prior to discharge.  ____________________________________________   FINAL CLINICAL IMPRESSION(S) / ED DIAGNOSES  Final diagnoses:  Acute deep vein thrombosis (DVT) of right upper extremity, unspecified vein (HCC)      NEW MEDICATIONS STARTED DURING THIS VISIT:  New Prescriptions   APIXABAN (ELIQUIS) 5 MG TABS TABLET    Take 2 tablets (10 mg total) by mouth 2 (two) times  daily for 7 days, THEN 1 tablet (5 mg total) 2 (two) times daily for 23 days.     Note:  This document was prepared using Dragon voice recognition software and may include unintentional dictation errors.    Arta Silence, MD 09/24/18 2115

## 2018-09-26 ENCOUNTER — Encounter (INDEPENDENT_AMBULATORY_CARE_PROVIDER_SITE_OTHER): Payer: Self-pay

## 2018-09-26 ENCOUNTER — Other Ambulatory Visit
Admission: RE | Admit: 2018-09-26 | Discharge: 2018-09-26 | Disposition: A | Discharge status: Home / Self Care | Payer: HRSA Program | Source: Ambulatory Visit | Attending: Vascular Surgery | Admitting: Vascular Surgery

## 2018-09-26 ENCOUNTER — Telehealth (INDEPENDENT_AMBULATORY_CARE_PROVIDER_SITE_OTHER): Payer: Self-pay

## 2018-09-26 ENCOUNTER — Other Ambulatory Visit (INDEPENDENT_AMBULATORY_CARE_PROVIDER_SITE_OTHER): Payer: Self-pay | Admitting: Nurse Practitioner

## 2018-09-26 ENCOUNTER — Ambulatory Visit (INDEPENDENT_AMBULATORY_CARE_PROVIDER_SITE_OTHER): Payer: Self-pay | Admitting: Vascular Surgery

## 2018-09-26 ENCOUNTER — Other Ambulatory Visit: Payer: Self-pay

## 2018-09-26 ENCOUNTER — Encounter (INDEPENDENT_AMBULATORY_CARE_PROVIDER_SITE_OTHER): Payer: Self-pay | Admitting: Vascular Surgery

## 2018-09-26 DIAGNOSIS — M79603 Pain in arm, unspecified: Secondary | ICD-10-CM | POA: Insufficient documentation

## 2018-09-26 DIAGNOSIS — I82621 Acute embolism and thrombosis of deep veins of right upper extremity: Secondary | ICD-10-CM

## 2018-09-26 DIAGNOSIS — Z1159 Encounter for screening for other viral diseases: Secondary | ICD-10-CM | POA: Insufficient documentation

## 2018-09-26 DIAGNOSIS — Z01812 Encounter for preprocedural laboratory examination: Secondary | ICD-10-CM | POA: Insufficient documentation

## 2018-09-26 DIAGNOSIS — Z7901 Long term (current) use of anticoagulants: Secondary | ICD-10-CM

## 2018-09-26 DIAGNOSIS — M79601 Pain in right arm: Secondary | ICD-10-CM

## 2018-09-26 NOTE — Telephone Encounter (Signed)
Spoke with the patient regarding her procedure with Dr. Delana Meyer on 09/28/2018, patient has a 9:30 am arrival time and will do her Covid testing today. Zero visitor policy as well as pre-procedure instructions were discussed and the patient was handed paperwork as well.

## 2018-09-26 NOTE — Progress Notes (Signed)
MRN : [id number]  Shelby MerinoLatonya D Hoffman is a 38 y.o. (01-22-1981) female who presents with chief complaint of  Chief Complaint  Patient presents with  . New Patient (Initial Visit)    Upper right arm blood clo  .  History of Present Illness:   The patient presents to the office for evaluation of DVT.  DVT was [facility] by Duplex ultrasound.  The initial symptoms were pain and swelling in the right upper extremity.  The patient notes that her arm continues to be very painful especially with dependency and it remains very swollen.  Symptoms are much better with elevation.    She does work with her arms over her head "all the time".  No direct trauma or long period of immobility.    The patient has not been using compression therapy at this point.  No SOB or pleuritic chest pains.  No cough or hemoptysis.  No blood per rectum or blood in any sputum.  No excessive bruising per the patient.   Current Meds  Medication Sig  . apixaban (ELIQUIS) 5 MG TABS tablet Take 2 tablets (10 mg total) by mouth 2 (two) times daily for 7 days, THEN 1 tablet (5 mg total) 2 (two) times daily for 23 days.  . naproxen (NAPROSYN) 500 MG tablet NAPROXEN 500 MG TABS  . sertraline (ZOLOFT) 100 MG tablet SERTRALINE HCL 100 MG TABS    Past Medical History:  Diagnosis Date  . Anxiety     Past Surgical History:  Procedure Laterality Date  . INDUCED ABORTION      Social History Social History   Tobacco Use  . Smoking status: Never Smoker  . Smokeless tobacco: Never Used  Substance Use Topics  . Alcohol use: No  . Drug use: No    Family History Family History  Problem Relation Age of Onset  . Miscarriages / Stillbirths Sister   . Diabetes Maternal Grandmother   . Cancer Neg Hx   . Heart disease Neg Hx   . Kidney disease Neg Hx   No family history of bleeding/clotting disorders, porphyria or autoimmune disease   Allergies  Allergen Reactions  . Tomato       REVIEW OF SYSTEMS (Negative unless checked)  Constitutional: [] Weight loss  [] Fever  [] Chills Cardiac: [] Chest pain   [] Chest pressure   [] Palpitations   [] Shortness of breath when laying flat   [] Shortness of breath with exertion. Vascular:  [] Pain in legs with walking   [] Pain in legs at rest  [x] History of DVT   [x] Phlebitis   [] Swelling in legs   [] Varicose veins   [] Non-healing ulcers Pulmonary:   [] Uses home oxygen   [] Productive cough   [] Hemoptysis   [] Wheeze  [] COPD   [] Asthma Neurologic:  [] Dizziness   [] Seizures   [] History of stroke   [] History of TIA  [] Aphasia   [] Vissual changes   [] Weakness or numbness in arm   [] Weakness or numbness in leg Musculoskeletal:   [] Joint swelling   [x] Joint pain   [] Low back pain Hematologic:  [] Easy bruising  [] Easy bleeding   [] Hypercoagulable state   [] Anemic Gastrointestinal:  [] Diarrhea   [] Vomiting  [] Gastroesophageal reflux/heartburn   [] Difficulty swallowing. Genitourinary:  [] Chronic kidney disease   [] Difficult urination  [] Frequent urination   [] Blood in urine Skin:  [] Rashes   [] Ulcers  Psychological:  [] History of anxiety   []  History of major depression.  Physical Examination  Vitals:   09/26/18 0950  BP: 106/73  Pulse: 73  Resp: 20  Weight: 189 lb (85.7 kg)  Height: 5\' 1"  (1.549 m)   Body mass index is 35.71 kg/m. Gen: WD/WN, NAD Head: [place]/AT, No temporalis wasting.  Ear/Nose/Throat: Hearing grossly intact, nares w/o erythema or drainage, poor dentition Eyes: PER, EOMI, sclera nonicteric.  Neck: Supple, no masses.  No bruit or JVD.  Pulmonary:  Good air movement, clear to auscultation bilaterally, no use of accessory muscles.  Cardiac: RRR, normal S1, S2, no Murmurs. Vascular: 4+ tight nonpitting edema Vessel Right Left  Radial Palpable Palpable  Brachial Palpable Palpable  Gastrointestinal: soft, non-distended. No guarding/no peritoneal signs.  Musculoskeletal: M/S 5/5 throughout.  No deformity or atrophy.   Neurologic: CN 2-12 intact. Pain and light touch intact in extremities.  Symmetrical.  Speech is fluent. Motor exam as listed above. Psychiatric: Judgment intact, Mood & affect appropriate for pt's clinical situation. Dermatologic: No rashes or ulcers noted.  No changes consistent with cellulitis. Lymph : No Cervical lymphadenopathy, no lichenification or skin changes of chronic lymphedema.  CBC Lab Results  Component Value Date   WBC 8.7 09/24/2018   HGB 12.4 09/24/2018   HCT 37.8 09/24/2018   MCV 95.0 09/24/2018   PLT 221 09/24/2018    BMET    Component Value Date/Time   NA 137 09/24/2018 1344   NA 137 01/20/2013 2003   K 4.0 09/24/2018 1344   K 3.4 (L) 01/20/2013 2003   CL 105 09/24/2018 1344   CL 107 01/20/2013 2003   CO2 23 09/24/2018 1344   CO2 23 01/20/2013 2003   GLUCOSE 99 09/24/2018 1344   GLUCOSE 107 (H) 01/20/2013 2003   BUN 18 09/24/2018 1344   BUN 13 01/20/2013 2003   CREATININE 0.51 09/24/2018 1344   CREATININE 0.85 01/20/2013 2003   CALCIUM 9.2 09/24/2018 1344   CALCIUM 9.6 01/20/2013 2003   GFRNONAA >60 09/24/2018 1344   GFRNONAA >60 01/20/2013 2003   GFRAA >60 09/24/2018 1344   GFRAA >60 01/20/2013 2003   Estimated Creatinine Clearance: 93.9 mL/min (by C-G formula based on SCr of 0.51 mg/dL).  COAG No results found for: INR, PROTIME  Radiology Dg Shoulder Right  Result Date: 09/24/2018 CLINICAL DATA:  RIGHT shoulder and arm pain, no injury EXAM: RIGHT SHOULDER - 2+ VIEW COMPARISON:  05/30/2015 FINDINGS: Osseous mineralization normal. AC joint alignment normal. Visualized ribs intact. No acute fracture, dislocation, or bone destruction. Linear radiopaque foreign body at RIGHT upper arm question implant. IMPRESSION: Normal exam. Electronically Signed   By: Ulyses SouthwardMark  Boles M.D.   On: 09/24/2018 17:56   [place] Venous Img Upper Uni Right  Result Date: 09/24/2018 CLINICAL DATA:  RIGHT arm and shoulder pain and swelling EXAM: RIGHT UPPER EXTREMITY VENOUS DOPPLER  ULTRASOUND TECHNIQUE: Gray-scale sonography with graded compression, as well as color Doppler and duplex ultrasound were performed to evaluate the upper extremity deep venous system from the level of the subclavian vein and including the jugular, axillary, basilic, radial, ulnar and upper cephalic vein. Spectral Doppler was utilized to evaluate flow at rest and with distal augmentation maneuvers. COMPARISON:  None FINDINGS: Contralateral Subclavian Vein: Respiratory phasicity is normal and symmetric with the symptomatic side. No evidence of thrombus. Normal compressibility. Internal Jugular Vein: No evidence of thrombus. Normal compressibility, respiratory phasicity and response to augmentation. Subclavian Vein: Filled by occlusive thrombus. Absent spontaneous venous flow. Axillary Vein: Filled by occlusive thrombus. Absent spontaneous venous flow. Impaired compressibility. Cephalic Vein: No evidence of thrombus. Normal compressibility, respiratory phasicity and  response to augmentation. Basilic Vein: Nonocclusive thrombus.  Impaired compressibility. Brachial Veins: Occlusive thrombus present. Absent spontaneous venous flow. Noncompressible. Radial Veins: No evidence of thrombus. Normal compressibility, respiratory phasicity and response to augmentation. Ulnar Veins: No evidence of thrombus. Normal compressibility, respiratory phasicity and response to augmentation. Venous Reflux:  None visualized. Other Findings:  None visualized. IMPRESSION: Deep venous thrombosis identified at RIGHT subclavian, axillary, brachial and basilic veins. Electronically Signed   By: Lavonia Dana M.D.   On: 09/24/2018 18:47   Dg Hand Complete Right  Result Date: 09/24/2018 CLINICAL DATA:  Right hand pain and swelling, laceration RIGHT index finger 09/06/2018 with a knife at work, was treated, wound healed, but this morning woke with pain and swelling in RIGHT index finger extending although in 4 EXAM: RIGHT HAND - COMPLETE 3+ VIEW  COMPARISON:  None FINDINGS: Diffuse soft tissue swelling, slightly greater at proximal phalanx of index finger. Osseous mineralization normal. Joint spaces preserved. No acute fracture, dislocation, or bone destruction. No radiopaque foreign bodies or soft tissue gas. IMPRESSION: No acute osseous abnormalities. Electronically Signed   By: Lavonia Dana M.D.   On: 09/24/2018 15:22     Assessment/Plan 1. Arm DVT (deep venous thromboembolism), acute, right ([place]) The patient will continue anticoagulation for now as there have not been any problems or complications at this point.  Thrombectomy is strongly indicated given her severe symptoms.  I have discussed and shown images of TOS and talked with her about the possible need for a first rib resection which would be done at [facility].  Risk and benefits were reviewed the patient.  Indications for the procedure were reviewed.  All questions were answered, the patient agrees to proceed.     A total of 65 minutes was spent with this patient and greater than 50% was spent in counseling and coordination of care with the patient.  Discussion included the treatment options for vascular disease including indications for surgery and intervention.  Also discussed is the appropriate timing of treatment.  In addition medical therapy was discussed.  2. Pain of right upper extremity See #1  Hortencia Pilar, MD  09/26/2018 10:04 AM

## 2018-09-27 LAB — NOVEL CORONAVIRUS, NAA (HOSP ORDER, SEND-OUT TO REF LAB; TAT 18-24 HRS): SARS-CoV-2, NAA: NOT DETECTED

## 2018-09-27 MED ORDER — CEFAZOLIN SODIUM-DEXTROSE 2-4 GM/100ML-% IV SOLN
2.0000 g | Freq: Once | INTRAVENOUS | Status: AC
  Administered 2018-09-28: 2 g via INTRAVENOUS

## 2018-09-28 ENCOUNTER — Encounter
Admission: RE | Disposition: A | Discharge status: Home / Self Care | Payer: Self-pay | Source: Home / Self Care | Attending: Vascular Surgery

## 2018-09-28 ENCOUNTER — Other Ambulatory Visit: Payer: Self-pay

## 2018-09-28 ENCOUNTER — Ambulatory Visit
Admission: RE | Admit: 2018-09-28 | Discharge: 2018-09-28 | Disposition: A | Discharge status: Home / Self Care | Payer: Medicaid Other | Attending: Vascular Surgery | Admitting: Vascular Surgery

## 2018-09-28 DIAGNOSIS — G540 Brachial plexus disorders: Secondary | ICD-10-CM

## 2018-09-28 DIAGNOSIS — I82A11 Acute embolism and thrombosis of right axillary vein: Secondary | ICD-10-CM

## 2018-09-28 DIAGNOSIS — I82621 Acute embolism and thrombosis of deep veins of right upper extremity: Secondary | ICD-10-CM | POA: Diagnosis not present

## 2018-09-28 DIAGNOSIS — Z7901 Long term (current) use of anticoagulants: Secondary | ICD-10-CM | POA: Diagnosis not present

## 2018-09-28 DIAGNOSIS — Z79899 Other long term (current) drug therapy: Secondary | ICD-10-CM | POA: Diagnosis not present

## 2018-09-28 DIAGNOSIS — I82B11 Acute embolism and thrombosis of right subclavian vein: Secondary | ICD-10-CM

## 2018-09-28 DIAGNOSIS — F419 Anxiety disorder, unspecified: Secondary | ICD-10-CM | POA: Insufficient documentation

## 2018-09-28 HISTORY — PX: PERIPHERAL VASCULAR THROMBECTOMY: CATH118306

## 2018-09-28 LAB — CREATININE, SERUM
Creatinine, Ser: 0.57 mg/dL (ref 0.44–1.00)
GFR calc Af Amer: >60 mL/min (ref >60)
GFR calc non Af Amer: >60 mL/min (ref >60)

## 2018-09-28 LAB — PREGNANCY, URINE: Preg Test, Ur: NEGATIVE

## 2018-09-28 LAB — BUN: BUN: 21 mg/dL — ABNORMAL HIGH (ref 6–20)

## 2018-09-28 SURGERY — PERIPHERAL VASCULAR THROMBECTOMY
Anesthesia: Moderate Sedation | Laterality: Right

## 2018-09-28 MED ORDER — MIDAZOLAM HCL 2 MG/ML PO SYRP: 8.0000 mg | ORAL_SOLUTION | Freq: Once | ORAL | Status: DC | PRN

## 2018-09-28 MED ORDER — APIXABAN 5 MG PO TABS
5.0000 mg | ORAL_TABLET | ORAL | Status: AC
  Administered 2018-09-28: 5 mg via ORAL
  Filled 2018-09-28: qty 1

## 2018-09-28 MED ORDER — METHYLPREDNISOLONE SODIUM SUCC 125 MG IJ SOLR: 125.0000 mg | Freq: Once | INTRAMUSCULAR | Status: DC | PRN

## 2018-09-28 MED ORDER — ONDANSETRON HCL 4 MG/2ML IJ SOLN: 4.0000 mg | Freq: Four times a day (QID) | INTRAMUSCULAR | Status: DC | PRN

## 2018-09-28 MED ORDER — IODIXANOL 320 MG/ML IV SOLN
INTRAVENOUS | Status: DC | PRN
  Administered 2018-09-28: 13:00:00 30 mL via INTRAVENOUS

## 2018-09-28 MED ORDER — FENTANYL CITRATE (PF) 100 MCG/2ML IJ SOLN
INTRAMUSCULAR | Status: AC
  Filled 2018-09-28: qty 2

## 2018-09-28 MED ORDER — HEPARIN SODIUM (PORCINE) 1000 UNIT/ML IJ SOLN
INTRAMUSCULAR | Status: AC
  Filled 2018-09-28: qty 1

## 2018-09-28 MED ORDER — ALTEPLASE 2 MG IJ SOLR
INTRAMUSCULAR | Status: DC | PRN
  Administered 2018-09-28 (×2): 6 mg

## 2018-09-28 MED ORDER — OXYCODONE HCL 5 MG PO TABS: 5.0000 mg | ORAL_TABLET | ORAL | Status: DC | PRN

## 2018-09-28 MED ORDER — HYDROCODONE-ACETAMINOPHEN 5-325 MG PO TABS: 1.0000 | ORAL_TABLET | Freq: Four times a day (QID) | ORAL | 0 refills | Status: AC | PRN

## 2018-09-28 MED ORDER — FENTANYL CITRATE (PF) 100 MCG/2ML IJ SOLN
INTRAMUSCULAR | Status: DC | PRN
  Administered 2018-09-28 (×2): 50 ug via INTRAVENOUS
  Administered 2018-09-28 (×3): 25 ug via INTRAVENOUS
  Administered 2018-09-28: 50 ug via INTRAVENOUS

## 2018-09-28 MED ORDER — SODIUM CHLORIDE 0.9% FLUSH: 3.0000 mL | INTRAVENOUS | Status: DC | PRN

## 2018-09-28 MED ORDER — ACETAMINOPHEN 325 MG PO TABS: 650.0000 mg | ORAL_TABLET | ORAL | Status: DC | PRN

## 2018-09-28 MED ORDER — MIDAZOLAM HCL 2 MG/2ML IJ SOLN
INTRAMUSCULAR | Status: DC | PRN
  Administered 2018-09-28 (×3): 1 mg via INTRAVENOUS
  Administered 2018-09-28: 2 mg via INTRAVENOUS

## 2018-09-28 MED ORDER — SODIUM CHLORIDE 0.9 % IV SOLN
INTRAVENOUS | Status: DC
  Administered 2018-09-28: 11:00:00 via INTRAVENOUS

## 2018-09-28 MED ORDER — ALTEPLASE 2 MG IJ SOLR
INTRAMUSCULAR | Status: AC
  Filled 2018-09-28: qty 8

## 2018-09-28 MED ORDER — SODIUM CHLORIDE 0.9 % IV SOLN: INTRAVENOUS | Status: AC

## 2018-09-28 MED ORDER — SODIUM CHLORIDE 0.9% FLUSH: 3.0000 mL | Freq: Two times a day (BID) | INTRAVENOUS | Status: DC

## 2018-09-28 MED ORDER — LIDOCAINE HCL (PF) 1 % IJ SOLN
INTRAMUSCULAR | Status: AC
  Filled 2018-09-28: qty 30

## 2018-09-28 MED ORDER — ALTEPLASE 2 MG IJ SOLR
INTRAMUSCULAR | Status: AC
  Filled 2018-09-28: qty 12

## 2018-09-28 MED ORDER — MIDAZOLAM HCL 5 MG/5ML IJ SOLN
INTRAMUSCULAR | Status: AC
  Filled 2018-09-28: qty 5

## 2018-09-28 MED ORDER — HEPARIN SODIUM (PORCINE) 1000 UNIT/ML IJ SOLN
INTRAMUSCULAR | Status: DC | PRN
  Administered 2018-09-28: 3000 [IU] via INTRAVENOUS
  Administered 2018-09-28: 2000 [IU] via INTRAVENOUS

## 2018-09-28 MED ORDER — HYDROMORPHONE HCL 1 MG/ML IJ SOLN: 1.0000 mg | Freq: Once | INTRAMUSCULAR | Status: DC | PRN

## 2018-09-28 MED ORDER — SODIUM CHLORIDE 0.9 % IV SOLN: 250.0000 mL | INTRAVENOUS | Status: DC | PRN

## 2018-09-28 MED ORDER — DIPHENHYDRAMINE HCL 50 MG/ML IJ SOLN: 50.0000 mg | Freq: Once | INTRAMUSCULAR | Status: DC | PRN

## 2018-09-28 MED ORDER — FAMOTIDINE 20 MG PO TABS: 40.0000 mg | ORAL_TABLET | Freq: Once | ORAL | Status: DC | PRN

## 2018-09-28 MED ORDER — MORPHINE SULFATE (PF) 4 MG/ML IV SOLN: 2.0000 mg | INTRAVENOUS | Status: DC | PRN

## 2018-09-28 SURGICAL SUPPLY — 20 items
BALLN DORADO 10X80X80 (BALLOONS) ×3
BALLN DORADO 8X100X80 (BALLOONS) ×3
BALLOON DORADO 10X80X80 (BALLOONS) ×1 IMPLANT
BALLOON DORADO 8X100X80 (BALLOONS) ×1 IMPLANT
CANISTER PENUMBRA ENGINE (MISCELLANEOUS) ×3 IMPLANT
CATH BEACON 5 .035 65 KMP TIP (CATHETERS) ×3 IMPLANT
CATH INDIGO D 50CM (CATHETERS) ×3 IMPLANT
CATH INDIGO SEP D (CATHETERS) ×3 IMPLANT
CATH INFUS 90CMX20CM (CATHETERS) ×3 IMPLANT
DEVICE PRESTO INFLATION (MISCELLANEOUS) ×3 IMPLANT
DEVICE TORQUE .025-.038 (MISCELLANEOUS) ×3 IMPLANT
GUIDEWIRE ANGLED .035 180CM (WIRE) ×3 IMPLANT
NEEDLE ENTRY 21GA 7CM ECHOTIP (NEEDLE) ×3 IMPLANT
PACK ANGIOGRAPHY (CUSTOM PROCEDURE TRAY) ×3 IMPLANT
SET INTRO CAPELLA COAXIAL (SET/KITS/TRAYS/PACK) ×3 IMPLANT
SHEATH BRITE TIP 8FRX11 (SHEATH) ×3 IMPLANT
SYR MEDRAD MARK 7 150ML (SYRINGE) ×3 IMPLANT
TUBING CONTRAST HIGH PRESS 72 (TUBING) ×3 IMPLANT
WIRE J 3MM .035X145CM (WIRE) ×3 IMPLANT
WIRE MAGIC TORQUE 260C (WIRE) ×3 IMPLANT

## 2018-09-28 NOTE — H&P (Signed)
[facility] History & Physical Update  The patient was interviewed and re-examined.  The patient's previous History and Physical has been reviewed and is unchanged.  There is no change in the plan of care. We plan to proceed with the scheduled procedure.  Hortencia Pilar, MD  09/28/2018, 12:51 PM

## 2018-09-28 NOTE — Progress Notes (Signed)
Dr. Delana Meyer at bedside ,speaking with pt. Re: procedural results and restrictions. MD gave pt. 2 weeks work release secondary to needing to keep right arm elevated. Pt. Verbalized understanding of all instructions and follow-up with Dr. Sandre Kitty at [facility]. PT. Ambulated to BR and back to room. Stable for DC home. Right arm intact with Coban wrap: no circulatory issues at present.

## 2018-09-28 NOTE — Op Note (Signed)
[facility]   OPERATIVE NOTE   PRE-OPERATIVE DIAGNOSIS: Extensive right arm DVT  POST-OPERATIVE DIAGNOSIS: same; thoracic outlet syndrome right arm  PROCEDURE: 1. US guidance for vascular access to right brachial vein  2. Catheter placement into SVC from right brachial vein approach  3. SVCgram and Right arm venogram 4. Catheter directed thrombolysis with 12 mg TPa to the right axillary and subclavian veins 5.   Mechanical thrombectomy to right axillary and subclavian veins 6. PTA of subclavian vein to 10 mm with a Dorado balloon   SURGEON: Hortencia Pilar, MD  ASSISTANT(S): none  ANESTHESIA: local with moderate conscious sedation for procedure is 75 minutes using parenteral Versed and  Fentanyl.  Continuous ECG pulse oximetry and cardiopulmonary monitoring were performed throughout the entire procedure by the interventional radiology nurse.  Total sedation time is as noted above.  ESTIMATED BLOOD LOSS: 200  FINDING(S): 1. Extensive thrombus within the axillary subclavian veins.  Once the thrombus has been removed a subtotal occlusion of the subclavian vein proximally is identified.  SPECIMEN(S): none  INDICATIONS:  Patient is a 38 y.o. female who presents with excruciating pain of the right upper extremity associated with massive swelling.  She was seen in the emergency room where duplex ultrasound demonstrated extensive deep vein thrombosis.  Venous intervention is performed to reduce the symtpoms and avoid long term postphlebitic symptoms. She canRisk and benefits were reviewed the patient.  Indications for the procedure were reviewed.  All questions were answered, the patient agrees to proceed.    DESCRIPTION: After obtaining full informed written consent, the patient was brought back to the vascular suite and placed supine upon the table.Moderate conscious sedation was administered during a face to face encounter with the patient  throughout the procedure with my supervision of the RN administering medicines and monitoring the patient's vital signs, pulse oximetry, telemetry and mental status throughout from the start of the procedure until the patient was taken to the recovery room. After obtaining adequate anesthesia, the patient was prepped and draped in the standard fashion.   The right brachial vein was then accessed under US guidance and found to be widely patent. It was echolucent and compressible indicating it is patent and it was accessed without difficulty and a permanent image was recorded. Microwire followed by a micro-sheath J-wire followed by an 8 French sheath was then placed.  Hand-injection of contrast was then used to demonstrate the venous anatomy of the right upper arm and shoulder.  This demonstrated the brachial vein was spared no significant thrombus was noted within the brachial veins.  However, the axillary vein demonstrated occlusive thrombus and there was no filling of the subclavian vein.  I then advanced a Glidewire and Kumpe catheter  the delivery sheath into the SVC. Hand-injection of contrast through the Kumpe catheter verified intraluminal placement demonstrated the proximal right innominate vein as well as the superior vena cava and right atrium are patent and free of any stricture or abnormality.  Magic torque wire was then advanced through the Kumpe catheter and an infusion catheter with a 20 cm infusion length was advanced across the lesion.  A total of 12 mg of TPA were then infused and allowed to dwell for approximately 30 minutes.  The penumbra cat 8 device was then used to perform mechanical thrombectomy beginning in the distal axillary vein and extending the thrombectomy through the entire length of the subclavian.  4 passes were made the initial pass was made over the wire  and then the remaining passes made using a separator.  Follow-up imaging now demonstrated resolution of the  thrombus.  There is less than 5% residual.  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  The Magic torque wire was then reintroduced.  And initially an 8 mm x 100 mm Dorado balloon was advanced across the lesion and inflated to 18 atm 1 minute.  Follow-up imaging demonstrated improvement but not adequate luminal gain and therefore a 10 mm x 80 mm Dorado balloon was advanced across the lesion 2 separate inflations were made.  Inflations were to 24 atm for approximately 1 minute.  Follow-up imaging now demonstrated patency of the vein with fairly brisk washout of the contrast injected from the brachial level.  This is a dramatic improvement compared to the stagnation observed in the previous images.  Given this finding I elected to terminate the procedure.  The sheath was removed and a dressing was placed. She was taken to the recovery room in stable condition having tolerated the procedure well.  Interpretation: Initially there is thrombus noted within the axillary and subclavian veins.  Following TPA infusion there is improvement this in conjunction with mechanical thrombectomy using the penumbra device yields near total resolution less than 5% residual thrombus.  There is stricture noted of the subclavian vein.  Following angioplasty to 10 mm we have an 8 mm lumen with rapid flow and washout of contrast indicating successful treatment of the lesion.  COMPLICATIONS: None  CONDITION: Stable  Hortencia Pilar 09/28/2018 12:52 PM

## 2018-09-29 ENCOUNTER — Encounter: Payer: Self-pay | Admitting: Vascular Surgery

## 2018-09-29 LAB — CULTURE, BLOOD (ROUTINE X 2)
Culture: NO GROWTH
Culture: NO GROWTH
Special Requests: ADEQUATE
Special Requests: ADEQUATE

## 2018-10-05 ENCOUNTER — Telehealth (INDEPENDENT_AMBULATORY_CARE_PROVIDER_SITE_OTHER): Payer: Self-pay

## 2018-10-05 NOTE — Telephone Encounter (Signed)
Patient called stating that she had a procedure with Dr. Delana Meyer on 10/05/2018 and was given a prescription for pain  ( Norco 5-325mg  ) for a month. Patient stated that she was only given a week's supply and was told to call if she needed more. I advised that she speak with the pharmacy because the prescription was called in with the entire amount so her Worker's comp may have this as a stipulation. Patient will attempt to contact her pharmacy or insurance regarding this.

## 2018-11-29 ENCOUNTER — Emergency Department
Admission: EM | Admit: 2018-11-29 | Discharge: 2018-11-29 | Disposition: A | Discharge status: Home / Self Care | Payer: Medicaid Other | Attending: Emergency Medicine | Admitting: Emergency Medicine

## 2018-11-29 ENCOUNTER — Encounter: Payer: Self-pay | Admitting: Emergency Medicine

## 2018-11-29 ENCOUNTER — Other Ambulatory Visit: Payer: Self-pay

## 2018-11-29 DIAGNOSIS — T8131XA Disruption of external operation (surgical) wound, not elsewhere classified, initial encounter: Secondary | ICD-10-CM | POA: Insufficient documentation

## 2018-11-29 DIAGNOSIS — Y658 Other specified misadventures during surgical and medical care: Secondary | ICD-10-CM | POA: Diagnosis not present

## 2018-11-29 DIAGNOSIS — Z79899 Other long term (current) drug therapy: Secondary | ICD-10-CM | POA: Diagnosis not present

## 2018-11-29 DIAGNOSIS — Z4801 Encounter for change or removal of surgical wound dressing: Secondary | ICD-10-CM | POA: Insufficient documentation

## 2018-11-29 DIAGNOSIS — Z5189 Encounter for other specified aftercare: Secondary | ICD-10-CM

## 2018-11-29 NOTE — ED Triage Notes (Signed)
Patient to ER for c/o postop complication. Patient had surgery for TOS on 8/18, went home with incision site glued with surgical glue. Patient states she had chest tube that they had placed tape over. When tape had to be removed, it tore some of surgical glue as well. Now has bleeding from site.

## 2018-11-29 NOTE — Discharge Instructions (Signed)
I put Dermabond over the small opening.  I did not see any signs of infection.  However you should follow-up with your doctor tomorrow and let them know that this has happened to make sure there is no other interventions that they would want.  Return for fevers, redness or any other concerns

## 2018-11-29 NOTE — ED Provider Notes (Signed)
[facility] Emergency Department Provider Note  ____________________________________________   First MD Initiated Contact with Patient 11/29/18 [phone]     (approximate)  I have reviewed the triage vital signs and the nursing notes.   HISTORY  Chief Complaint Post-op Problem    HPI Barbette MerinoLatonya D Shafer is a 38 y.o. female who is status post thoracic outlet [facility] who went home with an incision site that was glued with surgical glue in her Right upper chest..  Patient had a chest tube that had tape over it.  When he took the tape off they removed some the surgical glue so she started having some minor drainage.  The drainage is clear, minimal, nothing makes it better, nothing makes it worse.          Past Medical History:  Diagnosis Date  . Anxiety     Patient Active Problem List   Diagnosis Date Noted  . Arm DVT (deep venous thromboembolism), acute, right (HCC) 09/26/2018  . Arm pain 09/26/2018  . Premature rupture of membranes (PROM) with labor delayed by therapy 06/09/2016    Past Surgical History:  Procedure Laterality Date  . INDUCED ABORTION    . PERIPHERAL VASCULAR THROMBECTOMY Right 09/28/2018   Procedure: PERIPHERAL VASCULAR THROMBECTOMY;  Surgeon: Renford DillsSchnier, Gregory G, MD;  Location: [facility] INVASIVE CV LAB;  Service: Cardiovascular;  Laterality: Right;  . THORACIC OUTLET SURGERY      Prior to Admission medications   Medication Sig Start Date End Date Taking? Authorizing Provider  acetaminophen (TYLENOL) 325 MG tablet Take 650 mg by mouth every 6 (six) hours as needed (for pain.).    [provider]  apixaban (ELIQUIS) 5 MG TABS tablet Take 2 tablets (10 mg total) by mouth 2 (two) times daily for 7 days, THEN 1 tablet (5 mg total) 2 (two) times daily for 23 days. 09/24/18 10/24/18  Dionne BucySiadecki, Sebastian, MD  calcium carbonate (TUMS - DOSED IN MG ELEMENTAL CALCIUM) 500 MG chewable tablet Chew 2 tablets by mouth 2 (two) times  daily as needed for indigestion or heartburn.    [provider]  HYDROcodone-acetaminophen (NORCO) 5-325 MG tablet Take 1-2 tablets by mouth every 6 (six) hours as needed for moderate pain or severe pain. 09/28/18   Schnier, Latina CraverGregory G, MD    Allergies Tomato  Family History  Problem Relation Age of Onset  . Miscarriages / Stillbirths Sister   . Diabetes Maternal Grandmother   . Cancer Neg Hx   . Heart disease Neg Hx   . Kidney disease Neg Hx     Social History Social History   Tobacco Use  . Smoking status: Never Smoker  . Smokeless tobacco: Never Used  Substance Use Topics  . Alcohol use: No  . Drug use: No      Review of Systems Constitutional: No fever/chills Eyes: No visual changes. ENT: No sore throat. Cardiovascular: Denies chest pain.  Drainage from chest wall wound Respiratory: Denies shortness of breath. Gastrointestinal: No abdominal pain.  No nausea, no vomiting.  No diarrhea.  No constipation. Genitourinary: Negative for dysuria. Musculoskeletal: Negative for back pain. Skin: Negative for rash. Neurological: Negative for headaches, focal weakness or numbness. All other ROS negative ____________________________________________   PHYSICAL EXAM:  VITAL SIGNS: ED Triage Vitals  Enc Vitals Group     BP 11/29/18 0023 (!) 121/92     Pulse Rate 11/29/18 0023 66     Resp 11/29/18 0023 20     Temp  11/29/18 0023 99.1 F (37.3 C)     Temp Source 11/29/18 0023 Oral     SpO2 11/29/18 0023 100 %     Weight 11/29/18 0024 186 lb (84.4 kg)     Height 11/29/18 0024 5\' 1"  (1.549 m)     Head Circumference --      Peak Flow --      Pain Score 11/29/18 0023 5     Pain Loc --      Pain Edu? --      Excl. in [place]? --     Constitutional: Alert and oriented. Well appearing and in no acute distress. Eyes: Conjunctivae are normal. EOMI. Head: Atraumatic. Nose: No congestion/rhinnorhea. Mouth/Throat: Mucous membranes are moist.   Neck: No stridor. Trachea  Midline. FROM Cardiovascular: Normal rate, regular rhythm. Grossly normal heart sounds.  Good peripheral circulation.  Surgical scar that are well-healing.  1 cm opening at the right chest wall with very minimal clear drainage.  No redness or erythema around the area.  No fluctuation Respiratory: Normal respiratory effort.  No retractions. Lungs CTAB. Gastrointestinal: Soft and nontender. No distention. No abdominal bruits.  Musculoskeletal: No lower extremity tenderness nor edema.  No joint effusions. Neurologic:  Normal speech and language. No gross focal neurologic deficits are appreciated.  Skin:  Skin is warm, dry and intact. No rash noted. Psychiatric: Mood and affect are normal. Speech and behavior are normal. GU: Deferred   ____________________________________________   PROCEDURES  Procedure(s) performed (including Critical Care):  Marland KitchenMarland KitchenLaceration Repair  Date/Time: 11/29/2018 4:44 AM Performed by: Vanessa [place], MD Authorized by: Vanessa Winfield, MD   Consent:    Consent obtained:  Verbal   Consent given by:  Patient   Risks discussed:  Infection, nerve damage, need for additional repair, pain, poor cosmetic result, poor wound healing and retained foreign body   Alternatives discussed:  No treatment Anesthesia (see MAR for exact dosages):    Anesthesia method:  None Laceration details:    Length (cm):  1 Repair type:    Repair type:  Simple Skin repair:    Repair method:  Tissue adhesive     ____________________________________________   INITIAL IMPRESSION / ASSESSMENT AND PLAN / ED COURSE  Evianna Chandran Taylor was evaluated in Emergency Department on 11/29/2018 for the symptoms described in the history of present illness. She was evaluated in the context of the global COVID-19 pandemic, which necessitated consideration that the patient might be at risk for infection with the SARS-CoV-2 virus that causes COVID-19. Institutional protocols and algorithms that pertain to the  evaluation of patients at risk for COVID-19 are in a state of rapid change based on information released by regulatory bodies including the CDC and federal and state organizations. These policies and algorithms were followed during the patient's care in the ED.    Patient has a very small 1 cm opening from recent surgery.  There is no erythema around the wound to suggest cellulitis.  No fluctuation to suggest abscess.  This just occurred today and has very minimal clear drainage.  Given this I will placed Dermabond over it.  Patient already has follow-up with her doctors tomorrow so she was instructed to make sure that they let know about this happening to make sure there is no other interventions that they would want.  Patient is not having fevers and vital signs are reassuring.   ____________________________________________   FINAL CLINICAL IMPRESSION(S) / ED DIAGNOSES   Final diagnoses:  Visit for wound  check      MEDICATIONS GIVEN DURING THIS VISIT:  Medications - No data to display   ED Discharge Orders    None       Note:  This document was prepared using Dragon voice recognition software and may include unintentional dictation errors.   Concha Se, MD 11/29/18 [phone]

## 2019-01-12 ENCOUNTER — Emergency Department: Payer: Medicaid Other

## 2019-01-12 ENCOUNTER — Other Ambulatory Visit: Payer: Self-pay

## 2019-01-12 ENCOUNTER — Emergency Department
Admission: EM | Admit: 2019-01-12 | Discharge: 2019-01-12 | Disposition: A | Discharge status: Home / Self Care | Payer: Medicaid Other | Attending: Emergency Medicine | Admitting: Emergency Medicine

## 2019-01-12 ENCOUNTER — Encounter: Payer: Self-pay | Admitting: Emergency Medicine

## 2019-01-12 DIAGNOSIS — Z79899 Other long term (current) drug therapy: Secondary | ICD-10-CM | POA: Diagnosis not present

## 2019-01-12 DIAGNOSIS — M25511 Pain in right shoulder: Secondary | ICD-10-CM

## 2019-01-12 LAB — BASIC METABOLIC PANEL
Anion gap: 5 (ref 5–15)
BUN: 15 mg/dL (ref 6–20)
CO2: 24 mmol/L (ref 22–32)
Calcium: 8.8 mg/dL — ABNORMAL LOW (ref 8.9–10.3)
Chloride: 105 mmol/L (ref 98–111)
Creatinine, Ser: 0.69 mg/dL (ref 0.44–1.00)
GFR calc Af Amer: >60 mL/min (ref >60)
GFR calc non Af Amer: >60 mL/min (ref >60)
Glucose, Bld: 87 mg/dL (ref 70–99)
Potassium: 3.7 mmol/L (ref 3.5–5.1)
Sodium: 134 mmol/L — ABNORMAL LOW (ref 135–145)

## 2019-01-12 LAB — CBC
HCT: 33.4 % — ABNORMAL LOW (ref 36.0–46.0)
Hemoglobin: 10.9 g/dL — ABNORMAL LOW (ref 12.0–15.0)
MCH: 29.7 pg (ref 26.0–34.0)
MCHC: 32.6 g/dL (ref 30.0–36.0)
MCV: 91 fL (ref 80.0–100.0)
Platelets: 202 10*3/uL (ref 150–400)
RBC: 3.67 MIL/uL — ABNORMAL LOW (ref 3.87–5.11)
RDW: 13.7 % (ref 11.5–15.5)
WBC: 7 10*3/uL (ref 4.0–10.5)
nRBC: 0 % (ref 0.0–0.2)

## 2019-01-12 LAB — POCT PREGNANCY, URINE: Preg Test, Ur: NEGATIVE

## 2019-01-12 MED ORDER — MELOXICAM 7.5 MG PO TABS
7.5000 mg | ORAL_TABLET | Freq: Once | ORAL | Status: AC
  Administered 2019-01-12: 23:00:00 7.5 mg via ORAL
  Filled 2019-01-12: qty 1

## 2019-01-12 MED ORDER — CYCLOBENZAPRINE HCL 5 MG PO TABS: ORAL_TABLET | ORAL | 0 refills | Status: AC

## 2019-01-12 MED ORDER — IOHEXOL 300 MG/ML  SOLN
75.0000 mL | Freq: Once | INTRAMUSCULAR | Status: AC | PRN
  Administered 2019-01-12: 75 mL via INTRAVENOUS
  Filled 2019-01-12: qty 75

## 2019-01-12 MED ORDER — CYCLOBENZAPRINE HCL 10 MG PO TABS
5.0000 mg | ORAL_TABLET | Freq: Once | ORAL | Status: AC
  Administered 2019-01-12: 5 mg via ORAL
  Filled 2019-01-12: qty 1

## 2019-01-12 NOTE — ED Notes (Signed)
Patient transported to CT 
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
## 2019-01-12 NOTE — ED Notes (Signed)
Acuity changed due to increased use of resources.
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
## 2021-02-11 DIAGNOSIS — L03019 Cellulitis of unspecified finger: Secondary | ICD-10-CM | POA: Diagnosis not present

## 2021-05-12 DIAGNOSIS — Z13220 Encounter for screening for lipoid disorders: Secondary | ICD-10-CM | POA: Diagnosis not present

## 2021-05-12 DIAGNOSIS — Z1389 Encounter for screening for other disorder: Secondary | ICD-10-CM | POA: Diagnosis not present

## 2021-05-12 DIAGNOSIS — Z131 Encounter for screening for diabetes mellitus: Secondary | ICD-10-CM | POA: Diagnosis not present

## 2021-05-12 DIAGNOSIS — Z124 Encounter for screening for malignant neoplasm of cervix: Secondary | ICD-10-CM | POA: Diagnosis not present

## 2021-05-12 DIAGNOSIS — Z113 Encounter for screening for infections with a predominantly sexual mode of transmission: Secondary | ICD-10-CM | POA: Diagnosis not present

## 2021-05-12 DIAGNOSIS — Z0000 Encounter for general adult medical examination without abnormal findings: Secondary | ICD-10-CM | POA: Diagnosis not present

## 2022-06-29 ENCOUNTER — Emergency Department: Payer: 59

## 2022-06-29 ENCOUNTER — Other Ambulatory Visit: Payer: Self-pay

## 2022-06-29 ENCOUNTER — Emergency Department
Admission: EM | Admit: 2022-06-29 | Discharge: 2022-06-29 | Disposition: A | Discharge status: Home / Self Care | Payer: 59 | Attending: Emergency Medicine | Admitting: Emergency Medicine

## 2022-06-29 DIAGNOSIS — R0602 Shortness of breath: Secondary | ICD-10-CM | POA: Insufficient documentation

## 2022-06-29 DIAGNOSIS — W109XXA Fall (on) (from) unspecified stairs and steps, initial encounter: Secondary | ICD-10-CM | POA: Diagnosis not present

## 2022-06-29 DIAGNOSIS — W19XXXA Unspecified fall, initial encounter: Secondary | ICD-10-CM

## 2022-06-29 DIAGNOSIS — M25511 Pain in right shoulder: Secondary | ICD-10-CM | POA: Diagnosis not present

## 2022-06-29 DIAGNOSIS — S40011A Contusion of right shoulder, initial encounter: Secondary | ICD-10-CM | POA: Diagnosis not present

## 2022-06-29 DIAGNOSIS — R0789 Other chest pain: Secondary | ICD-10-CM | POA: Diagnosis not present

## 2022-06-29 DIAGNOSIS — R079 Chest pain, unspecified: Secondary | ICD-10-CM | POA: Diagnosis not present

## 2022-06-29 LAB — CBC
HCT: 35.7 % — ABNORMAL LOW (ref 36.0–46.0)
Hemoglobin: 11.6 g/dL — ABNORMAL LOW (ref 12.0–15.0)
MCH: 30.9 pg (ref 26.0–34.0)
MCHC: 32.5 g/dL (ref 30.0–36.0)
MCV: 94.9 fL (ref 80.0–100.0)
Platelets: 189 10*3/uL (ref 150–400)
RBC: 3.76 MIL/uL — ABNORMAL LOW (ref 3.87–5.11)
RDW: 13.5 % (ref 11.5–15.5)
WBC: 7.4 10*3/uL (ref 4.0–10.5)
nRBC: 0 % (ref 0.0–0.2)

## 2022-06-29 LAB — BASIC METABOLIC PANEL
Anion gap: 7 (ref 5–15)
BUN: 21 mg/dL — ABNORMAL HIGH (ref 6–20)
CO2: 23 mmol/L (ref 22–32)
Calcium: 8.8 mg/dL — ABNORMAL LOW (ref 8.9–10.3)
Chloride: 105 mmol/L (ref 98–111)
Creatinine, Ser: 0.83 mg/dL (ref 0.44–1.00)
GFR, Estimated: >60 mL/min (ref >60)
Glucose, Bld: 82 mg/dL (ref 70–99)
Potassium: 3.7 mmol/L (ref 3.5–5.1)
Sodium: 135 mmol/L (ref 135–145)

## 2022-06-29 LAB — TROPONIN I (HIGH SENSITIVITY): Troponin I (High Sensitivity): <2 ng/L (ref <18)

## 2022-06-29 MED ORDER — MELOXICAM 15 MG PO TABS: 15.0000 mg | ORAL_TABLET | Freq: Every day | ORAL | 0 refills | Status: AC

## 2022-06-29 MED ORDER — METHOCARBAMOL 500 MG PO TABS: 500.0000 mg | ORAL_TABLET | Freq: Four times a day (QID) | ORAL | 0 refills | Status: AC

## 2022-06-29 NOTE — ED Triage Notes (Addendum)
Pt to ED via POV with complaints of a fall. Pt states that she fell down the steps today. Pt denies LOC. Pt states that she is having R shoulder and arm pain. Pt states after the fall she started feeling SOB and CP. Pt states that she feels lightheaded. Pt has hx of anxiety and appears anxious in triage.  Pt discloses SI in triage but states will not act on it and refuses to dress out. First RN made aware and MD Siadecki made aware.

## 2022-06-29 NOTE — ED Provider Notes (Signed)
[facility] Provider Note  Patient Contact: 7:08 PM (approximate)   History   Fall and Chest Pain   HPI  Shelby Hoffman is a 42 y.o. female who presents the emergency department complaining of shoulder pain, initially chest tightness and shortness of breath as well.  Patient had a mechanical fall as she was coming down 5 stairs.  She missed a stair and fell landing on her right shoulder area.  Did not hit her head or lose consciousness.  She states that the pain started making her feel very anxious.  She endorsed this in triage, it was noted that patient had some intermittent suicidal ideation though not currently.  ED attending was summoned to triage and after lengthy discussion it was determined that patient did not in fact have any suicidal ideations.  Patient is denying any suicidal or homicidal ideations at this time.  Right now chest pain, shortness of breath, chest tightness and anxiety have calm down.  She is only complaining of some right shoulder pain at this time.     Physical Exam   Triage Vital Signs: ED Triage Vitals  Enc Vitals Group     BP 06/29/22 1634 126/67     Pulse Rate 06/29/22 1634 96     Resp 06/29/22 1634 20     Temp 06/29/22 1634 98.6 F (37 C)     Temp Source 06/29/22 1634 Oral     SpO2 06/29/22 1634 100 %     Weight 06/29/22 1635 198 lb (89.8 kg)     Height 06/29/22 1635 5\' 2"  (1.575 m)     Head Circumference --      Peak Flow --      Pain Score 06/29/22 1635 5     Pain Loc --      Pain Edu? --      Excl. in [place]? --     Most recent vital signs: Vitals:   06/29/22 1634  BP: 126/67  Pulse: 96  Resp: 20  Temp: 98.6 F (37 C)  SpO2: 100%     General: Alert and in no acute distress. Eyes:  PERRL. EOMI. Head: No acute traumatic findings  Neck: No stridor. No cervical spine tenderness to palpation.  Cardiovascular:  Good peripheral perfusion Respiratory: Normal respiratory effort without tachypnea or retractions.  Lungs CTAB. Good air entry to the bases with no decreased or absent breath sounds Musculoskeletal: Full range of motion to all extremities.  Neurologic:  No gross focal neurologic deficits are appreciated.  Visualization of the right shoulder reveals no obvious deformity.  She is diffusely tender along the clavicle, lateral shoulder and posterior shoulder.  No palpable abnormality.  No tenting of the skin.  No soft tissue injury identified. Skin:   No rash noted Other: Patient initially was endorsing some anxiety and possible suicidal ideation.  Was seen in triage by one of the ED attendings.  After lengthy discussion patient was not endorsing current suicidal ideation.  Patient denies any suicidal or homicidal thoughts at this time.   ED Results / Procedures / Treatments   Labs (all labs ordered are listed, but only abnormal results are displayed) Labs Reviewed  BASIC METABOLIC PANEL - Abnormal; Notable for the following components:      Result Value   BUN 21 (*)    Calcium 8.8 (*)    All other components within normal limits  CBC - Abnormal; Notable for the following components:   RBC 3.76 (*)  Hemoglobin 11.6 (*)    HCT 35.7 (*)    All other components within normal limits  POC URINE PREG, ED  TROPONIN I (HIGH SENSITIVITY)     EKG  ED ECG REPORT I, Charline Bills Vivan Agostino,  personally viewed and interpreted this ECG.   Date: 0/03/2022  EKG Time: 1637 hrs.  Rate: 67 bpm  Rhythm: unchanged from previous tracings, normal sinus rhythm  Axis: Normal axis  Intervals:none  ST&T Change: No ST elevation or depression noted  Normal sinus rhythm.  No STEMI.  No significant change from 09/16/2014    RADIOLOGY  I personally viewed, evaluated, and interpreted these images as part of my medical decision making, as well as reviewing the written report by the radiologist.  ED Provider Interpretation: No acute cardiopulmonary findings on chest xray.   DG Shoulder Right  Result  Date: 06/29/2022 CLINICAL DATA:  Fall with shoulder pain EXAM: RIGHT SHOULDER - 2+ VIEW COMPARISON:  01/12/2019 FINDINGS: There is no evidence of fracture or dislocation. There is no evidence of arthropathy or other focal bone abnormality. Soft tissues are unremarkable. IMPRESSION: Negative. Electronically Signed   By: Donavan Foil M.D.   On: 06/29/2022 20:22   DG Chest 2 View  Result Date: 06/29/2022 CLINICAL DATA:  CP EXAM: CHEST - 2 VIEW COMPARISON:  01/12/2019 FINDINGS: Cardiac silhouette is unremarkable. No pneumothorax or pleural effusion. The lungs are clear. The visualized skeletal structures are unremarkable. IMPRESSION: No acute cardiopulmonary process. Electronically Signed   By: Sammie Bench M.D.   On: 06/29/2022 17:19    PROCEDURES:  Critical Care performed: No  Procedures   MEDICATIONS ORDERED IN ED: Medications - No data to display   IMPRESSION / MDM / [place] / ED COURSE  I reviewed the triage vital signs and the nursing notes.                                 Differential diagnosis includes, but is not limited to, fall, shoulder separation, shoulder contusion, shoulder fracture, shoulder low dislocation, rib fracture, NSTEMI/ACS, anxiety, panic attack  Patient's presentation is most consistent with acute presentation with potential threat to life or bodily function.   Patient's diagnosis is consistent with fall, shoulder contusion.  Patient presents emergency department after a slip and fall.  She landed on her right shoulder.  Did not hit her head or lose consciousness.  Patient was complaining of pain in the shoulder joint, as well as some chest tightness and shortness of breath.  Cardiac workup is reassuring with no evidence of cardiac injury.  Suspect that this was secondary to anxiety.  Patient had noted some suicidal thoughts in the past, no current suicidal ideation and no concern at this time for acute psychosis.  Patient does not need to talk to  psychiatry and definitely does not need IVC at this time.  Patient will be treated symptomatically for the shoulder injury, anti-inflammatory muscle relaxer.  Follow-up primary care as needed..  Patient is given ED precautions to return to the ED for any worsening or new symptoms.     FINAL CLINICAL IMPRESSION(S) / ED DIAGNOSES   Final diagnoses:  Fall, initial encounter  Contusion of right shoulder, initial encounter     Rx / DC Orders   ED Discharge Orders          Ordered    meloxicam (MOBIC) 15 MG tablet  Daily  06/29/22 2110    methocarbamol (ROBAXIN) 500 MG tablet  4 times daily        06/29/22 2110             Note:  This document was prepared using Dragon voice recognition software and may include unintentional dictation errors.   Brynda Peon 06/29/22 2112    Harvest Dark, MD 06/29/22 2223

## 2022-06-29 NOTE — ED Notes (Signed)
MD Siadecki in triage to speak with pt

## 2022-06-29 NOTE — ED Provider Notes (Signed)
-----------------------------------------   5:12 PM on 06/29/2022 -----------------------------------------  I was asked to evaluate this patient in triage after she expressed some SI to the triage RN.  The patient reports that she has a long history of depression and is currently on Zoloft.  The dose was recently increased.  She gets this from her primary care doctor but is not currently seeing a psychiatrist or therapist.  She states that for a long time she will occasionally have vague suicidal thoughts and this is what she admitted to when asked by the nurse.  However, the patient states that she has no active thoughts of wanting to harm herself, has no plan, has not made any attempts to harm herself, and states that she would never actually harm herself because she cares too much about her children.  She does not feel that she is a danger to herself and declines psychiatric evaluation at this time.  She would like outpatient referral.  Based on my evaluation, the patient does not demonstrate acute danger to self or others.  There is no indication for emergent psychiatric evaluation, or for involuntary commitment.  We will provide the patient with outpatient resources as per her request.   Arta Silence, MD 06/29/22 2306

## 2023-06-23 ENCOUNTER — Other Ambulatory Visit: Payer: Self-pay | Admitting: Family Medicine

## 2023-06-23 DIAGNOSIS — Z1231 Encounter for screening mammogram for malignant neoplasm of breast: Secondary | ICD-10-CM
